# Patient Record
Sex: Male | Born: 1945 | Race: White | Hispanic: No | Marital: Married | Smoking: Former smoker
Health system: Southern US, Community
[De-identification: ages and names within clinical notes are randomized; demographics above are authoritative.]

## PROBLEM LIST (undated history)

## (undated) DIAGNOSIS — C787 Secondary malignant neoplasm of liver and intrahepatic bile duct: Secondary | ICD-10-CM

## (undated) DIAGNOSIS — R519 Headache, unspecified: Secondary | ICD-10-CM

## (undated) DIAGNOSIS — I10 Essential (primary) hypertension: Secondary | ICD-10-CM

## (undated) DIAGNOSIS — I00 Rheumatic fever without heart involvement: Secondary | ICD-10-CM

## (undated) DIAGNOSIS — F419 Anxiety disorder, unspecified: Secondary | ICD-10-CM

## (undated) DIAGNOSIS — C785 Secondary malignant neoplasm of large intestine and rectum: Secondary | ICD-10-CM

## (undated) DIAGNOSIS — C719 Malignant neoplasm of brain, unspecified: Secondary | ICD-10-CM

## (undated) DIAGNOSIS — I447 Left bundle-branch block, unspecified: Secondary | ICD-10-CM

## (undated) DIAGNOSIS — R42 Dizziness and giddiness: Secondary | ICD-10-CM

## (undated) DIAGNOSIS — R0989 Other specified symptoms and signs involving the circulatory and respiratory systems: Secondary | ICD-10-CM

## (undated) DIAGNOSIS — R51 Headache: Secondary | ICD-10-CM

## (undated) DIAGNOSIS — I771 Stricture of artery: Secondary | ICD-10-CM

## (undated) DIAGNOSIS — Z720 Tobacco use: Secondary | ICD-10-CM

## (undated) DIAGNOSIS — I639 Cerebral infarction, unspecified: Secondary | ICD-10-CM

## (undated) DIAGNOSIS — C3490 Malignant neoplasm of unspecified part of unspecified bronchus or lung: Secondary | ICD-10-CM

## (undated) HISTORY — DX: Essential (primary) hypertension: I10

## (undated) HISTORY — DX: Malignant neoplasm of unspecified part of unspecified bronchus or lung: C34.90

## (undated) HISTORY — DX: Tobacco use: Z72.0

## (undated) HISTORY — DX: Malignant neoplasm of brain, unspecified: C71.9

## (undated) HISTORY — DX: Rheumatic fever without heart involvement: I00

## (undated) HISTORY — PX: HERNIA REPAIR: SHX51

## (undated) HISTORY — DX: Cerebral infarction, unspecified: I63.9

## (undated) HISTORY — DX: Left bundle-branch block, unspecified: I44.7

## (undated) HISTORY — DX: Other specified symptoms and signs involving the circulatory and respiratory systems: R09.89

## (undated) HISTORY — DX: Stricture of artery: I77.1

## (undated) HISTORY — PX: MOHS SURGERY: SUR867

## (undated) HISTORY — PX: NOSE SURGERY: SHX723

---

## 1998-08-08 DIAGNOSIS — I639 Cerebral infarction, unspecified: Secondary | ICD-10-CM

## 1998-08-08 HISTORY — DX: Cerebral infarction, unspecified: I63.9

## 2002-08-08 HISTORY — PX: TRANSTHORACIC ECHOCARDIOGRAM: SHX275

## 2002-08-31 ENCOUNTER — Encounter: Payer: Self-pay | Admitting: Emergency Medicine

## 2002-08-31 ENCOUNTER — Inpatient Hospital Stay (HOSPITAL_COMMUNITY): Admission: EM | Admit: 2002-08-31 | Discharge: 2002-09-03 | Payer: Self-pay | Admitting: Emergency Medicine

## 2002-09-01 ENCOUNTER — Encounter: Payer: Self-pay | Admitting: *Deleted

## 2002-09-24 ENCOUNTER — Encounter: Admission: RE | Admit: 2002-09-24 | Discharge: 2002-12-23 | Payer: Self-pay | Admitting: Unknown Physician Specialty

## 2004-12-30 ENCOUNTER — Ambulatory Visit: Payer: Self-pay | Admitting: Family Medicine

## 2005-10-06 ENCOUNTER — Ambulatory Visit: Payer: Self-pay | Admitting: Family Medicine

## 2005-10-19 ENCOUNTER — Ambulatory Visit (HOSPITAL_COMMUNITY): Admission: RE | Admit: 2005-10-19 | Discharge: 2005-10-19 | Payer: Self-pay | Admitting: Family Medicine

## 2009-09-14 ENCOUNTER — Inpatient Hospital Stay (HOSPITAL_COMMUNITY): Admission: EM | Admit: 2009-09-14 | Discharge: 2009-09-16 | Payer: Self-pay | Admitting: Emergency Medicine

## 2009-11-20 ENCOUNTER — Ambulatory Visit (HOSPITAL_COMMUNITY): Admission: RE | Admit: 2009-11-20 | Discharge: 2009-11-20 | Payer: Self-pay | Admitting: General Surgery

## 2010-10-27 LAB — BASIC METABOLIC PANEL
BUN: 9 mg/dL (ref 6–23)
CO2: 26 mEq/L (ref 19–32)
Calcium: 9.9 mg/dL (ref 8.4–10.5)
Calcium: 7.7 mg/dL — ABNORMAL LOW (ref 8.4–10.5)
Chloride: 106 mEq/L (ref 96–112)
Creatinine, Ser: 1 mg/dL (ref 0.4–1.5)
Creatinine, Ser: 0.84 mg/dL (ref 0.4–1.5)
GFR calc Af Amer: >60 mL/min (ref >60)
GFR calc Af Amer: >60 mL/min (ref >60)
GFR calc non Af Amer: >60 mL/min (ref >60)
GFR calc non Af Amer: >60 mL/min (ref >60)
Glucose, Bld: 124 mg/dL — ABNORMAL HIGH (ref 70–99)
Potassium: 4.4 mEq/L (ref 3.5–5.1)
Sodium: 138 mEq/L (ref 135–145)
Sodium: 132 mEq/L — ABNORMAL LOW (ref 135–145)

## 2010-10-27 LAB — POCT I-STAT, CHEM 8
Creatinine, Ser: 1.9 mg/dL — ABNORMAL HIGH (ref 0.4–1.5)
HCT: 47 % (ref 39.0–52.0)
Hemoglobin: 16 g/dL (ref 13.0–17.0)
Potassium: 3.9 mEq/L (ref 3.5–5.1)
Sodium: 129 mEq/L — ABNORMAL LOW (ref 135–145)
TCO2: 23 mmol/L (ref 0–100)

## 2010-10-27 LAB — TSH: TSH: 1.616 u[IU]/mL (ref 0.350–4.500)

## 2010-10-27 LAB — DIFFERENTIAL
Basophils Absolute: 0 10*3/uL (ref 0.0–0.1)
Basophils Absolute: 0 10*3/uL (ref 0.0–0.1)
Basophils Relative: 1 % (ref 0–1)
Basophils Relative: 0 % (ref 0–1)
Eosinophils Absolute: 0.3 10*3/uL (ref 0.0–0.7)
Eosinophils Absolute: 0 10*3/uL (ref 0.0–0.7)
Eosinophils Relative: 4 % (ref 0–5)
Eosinophils Relative: 0 % (ref 0–5)
Lymphocytes Relative: 41 % (ref 12–46)
Lymphocytes Relative: 10 % — ABNORMAL LOW (ref 12–46)
Lymphs Abs: 3.1 10*3/uL (ref 0.7–4.0)
Lymphs Abs: 1.4 10*3/uL (ref 0.7–4.0)
Monocytes Absolute: 0.5 10*3/uL (ref 0.1–1.0)
Monocytes Absolute: 0.8 10*3/uL (ref 0.1–1.0)
Monocytes Relative: 7 % (ref 3–12)
Monocytes Relative: 6 % (ref 3–12)
Neutro Abs: 3.7 10*3/uL (ref 1.7–7.7)
Neutro Abs: 11.4 10*3/uL — ABNORMAL HIGH (ref 1.7–7.7)
Neutrophils Relative %: 48 % (ref 43–77)
Neutrophils Relative %: 84 % — ABNORMAL HIGH (ref 43–77)

## 2010-10-27 LAB — CBC
HCT: 43.2 % (ref 39.0–52.0)
HCT: 44.7 % (ref 39.0–52.0)
Hemoglobin: 14.9 g/dL (ref 13.0–17.0)
Hemoglobin: 11.6 g/dL — ABNORMAL LOW (ref 13.0–17.0)
Hemoglobin: 15.8 g/dL (ref 13.0–17.0)
MCHC: 34.6 g/dL (ref 30.0–36.0)
MCHC: 35.3 g/dL (ref 30.0–36.0)
MCV: 94.7 fL (ref 78.0–100.0)
MCV: 91.2 fL (ref 78.0–100.0)
Platelets: 200 10*3/uL (ref 150–400)
Platelets: 88 10*3/uL — ABNORMAL LOW (ref 150–400)
RBC: 4.56 MIL/uL (ref 4.22–5.81)
RBC: 3.61 MIL/uL — ABNORMAL LOW (ref 4.22–5.81)
RBC: 4.91 MIL/uL (ref 4.22–5.81)
RDW: 14.4 % (ref 11.5–15.5)
RDW: 14.4 % (ref 11.5–15.5)
WBC: 7.6 10*3/uL (ref 4.0–10.5)
WBC: 13.7 10*3/uL — ABNORMAL HIGH (ref 4.0–10.5)
WBC: 5.6 10*3/uL (ref 4.0–10.5)

## 2010-10-27 LAB — PROTIME-INR
INR: 0.95 (ref 0.00–1.49)
Prothrombin Time: 12.6 seconds (ref 11.6–15.2)

## 2010-10-27 LAB — CULTURE, BLOOD (ROUTINE X 2): Culture: NO GROWTH

## 2010-10-27 LAB — APTT: aPTT: 27 seconds (ref 24–37)

## 2010-12-24 NOTE — Discharge Summary (Signed)
NAMEMISAEL, Anthony Lopez NO.:  [id number]   MEDICAL RECORD NO.:  [id number]                   PATIENT TYPE:  INP   LOCATION:  3004                                 FACILITY:  [facility]   PHYSICIAN:  Deanna Artis. Hickling, M.D.           DATE OF BIRTH:  Jul 04, 1946   DATE OF ADMISSION:  08/31/2002  DATE OF DISCHARGE:  09/03/2002                                 DISCHARGE SUMMARY   DIAGNOSES AT TIME OF DISCHARGE:  1. Bilateral subcortical ischemic infarcts, one in the left putamen and the     parietal and subcortical white matter, and then one in the right dorsal     thalamus.  2. Hypertension, newly diagnosed.  3. Dyslipidemia, newly diagnosed.  4. Hyperhomocystinemia, newly diagnosed.  5. Type 2 diabetes, newly diagnosed.   DISCHARGE MEDICATIONS:  1. Aspirin 325 mg q.d.  2. Lopressor 50 mg b.i.d.  3. Foltx 1 q.d.   DISCHARGE DIET:  Low fat, low salt, low cholesterol, regular consistency,  thin liquids.   STUDIES PERFORMED:  1. CT of the head performed on admission shows a small hypodensity in the     left parietal white matter.  No acute hemorrhage.  Otherwise     unremarkable.  2. MRI of the brain which shows an acute ischemic infarct in the left     putamen, tail of the caudate a subacute right dorsal thalamic infarct.  3. MRA of the head and neck shows decreased signal at the bilateral MCA and     ACA without occlusion, the rest of the vasculature is normal.  4. A 2D echocardiogram shows an ejection fraction of 55-65% with no left     ventricular or regional wall abnormalities.  No embolic source.  5. EKG shows normal sinus rhythm with PAC.   LABORATORY STUDIES:  Hemoglobin A1c elevated at 9.9, homocystine elevated at  16.84.  Lipids shows normal cholesterol at 182, triglycerides 112, ACL 40,  and LDL elevated at 120.  CBC shows elevated white blood cells at 10.6,  hemoglobin 16.0.  Differential was normal.  The coagulation studies were  normal.   Chemistries were normal except for slightly elevated glucose at  115.  Liver function tests were normal.   HISTORY OF PRESENT ILLNESS:  The patient is a 65 year old right-handed white  male with no significant medical history, was awakened about 4 o'clock,  drank coffee, smoked a cigarette and went back to bed.  At 5:30 he woke up  and his finance noted he was staggering and his speech was slurred.  She  stated his words made sense, but sounded thick-tongued.  The patient denied  any focal weakness, numbness, visual disturbance , etc.  Symptoms persisted  and the patient was brought to the emergency department.  The patient was  thought to be a candidate secondary to time and lack of severity of  symptoms.   HOSPITAL COURSE:  The patient was admitted for stroke workup.  CT of the  head did show a small left parietal lacune with age indeterminate.  MRI  confirmed the presence of acute strokes, one in the left putamen, tail of  the caudate and one subacute in the right dorsal thalamus.  Workup was  negative for embolic source, however other significant risk factors were  identified including hypertension, cigarette abuse, dyslipidemia,  hyperhomocystinemia, and diabetes.  The patient was placed on aspirin for  stroke prophylaxis and his symptoms needed to be controlled for stroke  prevention in the future.   The patient will be discharged to home with followup by [facility] in 4-6 weeks, as  well as followup by primary care physician.  We will set up for diabetes  education as an outpatient, as well as diet education for wife.   CONDITION AT DISCHARGE:  The patient was alert and oriented x3.  Speech  dysarthric without __________ or paraphasia.  He has no aphasia.  His lower  face is weak with a droop.  Extraocular movements are intact.  Pupils equal  and reactive.  Visual fields full.  Strength is normal.  He has a mild left  pronator drift.  He had, however, clumsy  left hand with decreased  fine  motor movement.  Chest was clear to auscultation.  His heart rate is  regular, and his gait is steady.   PLAN:  1. Aspirin 325 q.d. for stroke prophylaxis.  2. Lopressor started for hypertension control.  Consider ACE inhibitor in     future.  3. Will follow up lipids as an outpatient.  He may need to consider statin     in the future.  The patient needs diabetic education.  [facility].  Hopefully can be diet-controlled.  4. Stop smoking. Referral given to heart center for free smoking cessation     classes.  5. Followup with [facility] in 4-6 weeks.  Make an appointment with the P.A[facility] on the     days that Dr. Sharene Skeans is there.  6. Diet education and adjustment per wife.  7. Discharged to home with wife.     Annie Main, N.P.                         Deanna Artis. Sharene Skeans, M.D.    SB/MEDQ  D:  09/03/2002  T:  09/03/2002  Job:  [id number]   cc:   Deanna Artis. Sharene Skeans, M.D.  [address]  [address]  [place]  [place] [address]  Fax: [phone]   Haydon Dorris  [address]  [place]  [place] [address]  Fax: [phone]   Loretha Brasil  [address]  [place]  [place] [address]  Fax: [phone]

## 2013-07-16 ENCOUNTER — Ambulatory Visit (INDEPENDENT_AMBULATORY_CARE_PROVIDER_SITE_OTHER): Payer: Medicare Other | Admitting: Physician Assistant

## 2013-07-16 ENCOUNTER — Encounter: Payer: Self-pay | Admitting: Physician Assistant

## 2013-07-16 VITALS — BP 140/80 | HR 62 | Ht 70.0 in | Wt 145.0 lb

## 2013-07-16 DIAGNOSIS — Z8619 Personal history of other infectious and parasitic diseases: Secondary | ICD-10-CM

## 2013-07-16 DIAGNOSIS — R0989 Other specified symptoms and signs involving the circulatory and respiratory systems: Secondary | ICD-10-CM | POA: Insufficient documentation

## 2013-07-16 DIAGNOSIS — R0789 Other chest pain: Secondary | ICD-10-CM

## 2013-07-16 DIAGNOSIS — Z720 Tobacco use: Secondary | ICD-10-CM

## 2013-07-16 DIAGNOSIS — F17200 Nicotine dependence, unspecified, uncomplicated: Secondary | ICD-10-CM

## 2013-07-16 DIAGNOSIS — I10 Essential (primary) hypertension: Secondary | ICD-10-CM

## 2013-07-16 DIAGNOSIS — R079 Chest pain, unspecified: Secondary | ICD-10-CM

## 2013-07-16 DIAGNOSIS — I447 Left bundle-branch block, unspecified: Secondary | ICD-10-CM

## 2013-07-16 DIAGNOSIS — I639 Cerebral infarction, unspecified: Secondary | ICD-10-CM

## 2013-07-16 DIAGNOSIS — I6350 Cerebral infarction due to unspecified occlusion or stenosis of unspecified cerebral artery: Secondary | ICD-10-CM

## 2013-07-16 DIAGNOSIS — Z8679 Personal history of other diseases of the circulatory system: Secondary | ICD-10-CM | POA: Insufficient documentation

## 2013-07-16 NOTE — Progress Notes (Signed)
Date:  07/16/2013   ID:  Hassell Halim, DOB [birth year], MRN [id number]  PCP:  No primary provider on file.  Primary Cardiologist:  New patient Herbie Baltimore)     History of Present Illness: Shante Maysonet is a 67 y.o. male with a history of tobacco abuse since age 77, hypertension, left bundle branch block, CVA in approximately 2000, rule out rheumatic fever age 52-15. His father is deceased at age 70 and had coronary artery bypass grafting at age 38 of diabetes. His mother is currently living age 13 and has diabetes.  Patient is seen at [facility].  Patient reports that he's had a cough and drainage for about 2 weeks and was told to take Sudafed. He complains of a stuffy nose. Yesterday he developed sharp chest pain which lasted approximately an hour period was 5/10 in intensity it did not radiate. He also denies nausea, vomiting, shortness of breath, dizziness, diaphoresis.  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  He worked this past Sunday without any symptoms.  He also denies orthopnea, dizziness, PND, abdominal pain, hematochezia, melena, lower extremity edema, claudication.  Wt Readings from Last 3 Encounters:  07/16/13 145 lb (65.772 kg)     Past Medical History  Diagnosis Date  . CVA (cerebral infarction) 2000  . HTN (hypertension), benign   . Rheumatic fever     Age 75 or 17  . Tobacco abuse     Current Outpatient Prescriptions  Medication Sig Dispense Refill  . aspirin 81 MG tablet Take 81 mg by mouth daily.      Marland Kitchen lisinopril (PRINIVIL,ZESTRIL) 10 MG tablet Take 10 mg by mouth daily.      . Omega-3 Fatty Acids (FISH OIL) 1000 MG CAPS Take by mouth.       No current facility-administered medications for this visit.    Allergies:   Allergies not on file  Social History:  The patient  reports that he has been smoking  Cigarettes.  He has a 125 pack-year smoking history. He does not have any smokeless tobacco history on file.   Family history:   Family History  Problem Relation Age of Onset  . Diabetes Mother   . Diabetes Father   . Heart attack Father     ROS:  Please see the history of present illness.  All other systems reviewed and negative.   PHYSICAL EXAM: VS:  BP 140/80  Pulse 62  Ht 5\' 10"  (1.778 m)  Wt 145 lb (65.772 kg)  BMI 20.81 kg/m2 Well nourished, well developed, in no acute distress HEENT: Pupils are equal round react to light accommodation extraocular movements are intact.  Neck: no JVDNo cervical lymphadenopathy.  Bilateral carotid bruits Cardiac: Regular rate and rhythm with 1/6  Murmur. No rubs or gallops. Lungs:  clear to auscultation bilaterally, no wheezing, rhonchi or rales Abd: soft, nontender, positive bowel sounds all quadrants, no hepatosplenomegaly Ext: no lower extremity edema.  2+ radial and dorsalis pedis pulses. Skin: warm and dry Neuro:  Grossly normal  EKG:  NSR, LBBB 62BPM    ASSESSMENT AND PLAN:  Problem List Items Addressed This Visit   CVA (cerebral vascular accident); 2000   Relevant Medications      lisinopril (PRINIVIL,ZESTRIL) 10 MG tablet      aspirin 81 MG tablet   Hx of rheumatic fever   Tobacco  abuse     We discussed tobacco cessation for about five minutes and included nicotine substitutes and chantix.  He is contemplating it.      HTN (hypertension)     BP well controlled on lisinopril.    Chest pain, atypical     I do not think the patient's CP is typical for cardiac etiology, however, he does have risk factors and a LBBB.  The LBBB was seen on an EKG from June 2014.  I am going to schedule a exercise myoview and follow up with Dr. Herbie Baltimore.    LBBB (left bundle branch block), chronic   Relevant Orders      Myocardial Perfusion Imaging   Carotid bruit present, bilateral     Carotid dopplers will be scheduled.     Other Visit  Diagnoses   Chest pain    -  Primary    Relevant Orders       EKG 12-Lead       Myocardial Perfusion Imaging    Carotid bruit        Relevant Orders       Doppler carotid    Tobacco use        Relevant Orders       Myocardial Perfusion Imaging

## 2013-07-16 NOTE — Assessment & Plan Note (Signed)
Carotid dopplers will be scheduled.

## 2013-07-16 NOTE — Assessment & Plan Note (Signed)
We discussed tobacco cessation for about five minutes and included nicotine substitutes and chantix.  He is contemplating it.

## 2013-07-16 NOTE — Patient Instructions (Signed)
1.  We will schedule you for a stress test and ultrasound of your neck arteries. 2.  Follow up with Dr. Herbie Baltimore after the studies are complete.

## 2013-07-16 NOTE — Assessment & Plan Note (Signed)
BP well controlled on lisinopril

## 2013-07-16 NOTE — Assessment & Plan Note (Signed)
I do not think the patient's CP is typical for cardiac etiology, however, he does have risk factors and a LBBB.  The LBBB was seen on an EKG from June 2014.  I am going to schedule a exercise myoview and follow up with Dr. Herbie Baltimore.

## 2013-07-18 ENCOUNTER — Ambulatory Visit (HOSPITAL_COMMUNITY)
Admission: RE | Admit: 2013-07-18 | Discharge: 2013-07-18 | Disposition: A | Discharge status: Home / Self Care | Payer: Medicare Other | Source: Ambulatory Visit | Attending: Internal Medicine | Admitting: Internal Medicine

## 2013-07-18 DIAGNOSIS — R0989 Other specified symptoms and signs involving the circulatory and respiratory systems: Secondary | ICD-10-CM

## 2013-07-18 DIAGNOSIS — I771 Stricture of artery: Secondary | ICD-10-CM | POA: Insufficient documentation

## 2013-07-18 HISTORY — PX: OTHER SURGICAL HISTORY: SHX169

## 2013-07-18 NOTE — Progress Notes (Signed)
Carotid Duplex Completed. °Brianna L Mazza,RVT °

## 2013-07-19 ENCOUNTER — Encounter: Payer: Self-pay | Admitting: Cardiology

## 2013-07-22 ENCOUNTER — Telehealth: Payer: Self-pay | Admitting: *Deleted

## 2013-07-22 NOTE — Telephone Encounter (Signed)
Order placed for repeat carotid artery doppler.

## 2013-07-22 NOTE — Telephone Encounter (Signed)
Message copied by Marella Bile on Mon Jul 22, 2013  3:10 PM ------      Message from: Runell Gess      Created: Mon Jul 22, 2013  8:33 AM       Mildly abn study. Repeat 12 months ------

## 2013-07-26 ENCOUNTER — Encounter (HOSPITAL_COMMUNITY): Payer: Medicare Other

## 2013-08-06 ENCOUNTER — Ambulatory Visit (HOSPITAL_COMMUNITY)
Admission: RE | Admit: 2013-08-06 | Discharge: 2013-08-06 | Disposition: A | Discharge status: Home / Self Care | Payer: Medicare Other | Source: Ambulatory Visit | Attending: Cardiovascular Disease | Admitting: Cardiovascular Disease

## 2013-08-06 DIAGNOSIS — R079 Chest pain, unspecified: Secondary | ICD-10-CM

## 2013-08-06 DIAGNOSIS — Z8673 Personal history of transient ischemic attack (TIA), and cerebral infarction without residual deficits: Secondary | ICD-10-CM | POA: Insufficient documentation

## 2013-08-06 DIAGNOSIS — R0989 Other specified symptoms and signs involving the circulatory and respiratory systems: Secondary | ICD-10-CM | POA: Insufficient documentation

## 2013-08-06 DIAGNOSIS — I10 Essential (primary) hypertension: Secondary | ICD-10-CM | POA: Insufficient documentation

## 2013-08-06 DIAGNOSIS — F17200 Nicotine dependence, unspecified, uncomplicated: Secondary | ICD-10-CM | POA: Insufficient documentation

## 2013-08-06 DIAGNOSIS — I447 Left bundle-branch block, unspecified: Secondary | ICD-10-CM

## 2013-08-06 DIAGNOSIS — Z8249 Family history of ischemic heart disease and other diseases of the circulatory system: Secondary | ICD-10-CM | POA: Insufficient documentation

## 2013-08-06 DIAGNOSIS — Z720 Tobacco use: Secondary | ICD-10-CM

## 2013-08-06 HISTORY — PX: NM MYOVIEW LTD: HXRAD82

## 2013-08-06 MED ORDER — REGADENOSON 0.4 MG/5ML IV SOLN
0.4000 mg | Freq: Once | INTRAVENOUS | Status: AC
  Administered 2013-08-06: 0.4 mg via INTRAVENOUS

## 2013-08-06 MED ORDER — AMINOPHYLLINE 25 MG/ML IV SOLN
100.0000 mg | Freq: Once | INTRAVENOUS | Status: AC
  Administered 2013-08-06: 100 mg via INTRAVENOUS

## 2013-08-06 MED ORDER — TECHNETIUM TC 99M SESTAMIBI GENERIC - CARDIOLITE
30.0000 | Freq: Once | INTRAVENOUS | Status: AC | PRN
  Administered 2013-08-06: 30 via INTRAVENOUS

## 2013-08-06 MED ORDER — TECHNETIUM TC 99M SESTAMIBI GENERIC - CARDIOLITE
10.0000 | Freq: Once | INTRAVENOUS | Status: AC | PRN
  Administered 2013-08-06: 10 via INTRAVENOUS

## 2013-08-06 NOTE — Procedures (Addendum)
[facility] [address] [place] [address] [phone]  Cardiology Nuclear Med Study  Legend Anthony Lopez is a 67 y.o. male     MRN : [id number]     DOB: Jan 06, 1946  Procedure Date: 08/06/2013  Nuclear Med Background Indication for Stress Test:  Evaluation for Ischemia History:  RHF;MURMUR Cardiac Risk Factors: CVA, Family History - CAD, Hypertension, LBBB, Smoker and BILATERAL CAROTID BRUITS  Symptoms:  Chest Pain   Nuclear Pre-Procedure Caffeine/Decaff Intake:  7:00pm NPO After: 5:00am   IV Site: R Forearm  IV 0.9% NS with Angio Cath:  22g  Chest Size (in):  36"  IV Started by: Emmit Pomfret, RN  Height: 5\' 10"  (1.778 m)  Cup Size: n/a  BMI:  Body mass index is 20.81 kg/(m^2). Weight:  145 lb (65.772 kg)   Tech Comments:  N/A    Nuclear Med Study 1 or 2 day study: 1 day  Stress Test Type:  Lexiscan  Order Authorizing Provider:  Bryan Lemma, MD   Resting Radionuclide: Technetium 27m Sestamibi  Resting Radionuclide Dose: 9.5 mCi   Stress Radionuclide:  Technetium 43m Sestamibi  Stress Radionuclide Dose: 30.6 mCi           Stress Protocol Rest HR: 52 Stress HR: 76  Rest BP: 137/70 Stress BP: 151/69  Exercise Time (min): n/a METS: n/a   Predicted Max HR: 155 bpm % Max HR: 49.03 bpm Rate Pressure Product: 95284  Dose of Adenosine (mg):  n/a Dose of Lexiscan: 0.4 mg  Dose of Atropine (mg): n/a Dose of Dobutamine: n/a mcg/kg/min (at max HR)  Stress Test Technologist: Esperanza Sheets, CCT Nuclear Technologist: Koren Shiver, CNMT   Rest Procedure:  Myocardial perfusion imaging was performed at rest 45 minutes following the intravenous administration of Technetium 42m Sestamibi. Stress Procedure:  The patient received IV Lexiscan 0.4 mg over 15-seconds.  Technetium 74m Sestamibi injected at 30-seconds.  There were no significant changes with Lexiscan. The patient experienced SOB and nausea; 100 mg of IV  Aminophylline was administered with resolution of symptoms.  Quantitative spect images were obtained after a 45 minute delay.  Transient Ischemic Dilatation (Normal <1.22):  1.01 Lung/Heart Ratio (Normal <0.45):  0.27 QGS EDV:  94 ml QGS ESV:  45 ml LV Ejection Fraction: 53%  Signed by       Rest ECG: NSR-LBBB  Stress ECG: No significant change from baseline ECG  QPS Raw Data Images:  Normal; no motion artifact; normal heart/lung ratio. Stress Images:  There is decreased uptake in the septum. Rest Images:  There is decreased uptake in the septum. Subtraction (SDS):  No evidence of ischemia.  Impression Exercise Capacity:  Lexiscan with no exercise. BP Response:  Normal blood pressure response. Clinical Symptoms:  No significant symptoms noted. ECG Impression:  No significant ST segment change suggestive of ischemia. Comparison with Prior Nuclear Study: No images to compare  Overall Impression:  Low risk stress nuclear study Septal thinning c/w BBB artifact.  LV Wall Motion:  NL LV Function; NL Wall Motion   Runell Gess, MD  08/06/2013 1:45 PM

## 2013-08-09 ENCOUNTER — Encounter: Payer: Self-pay | Admitting: Cardiology

## 2013-08-09 ENCOUNTER — Ambulatory Visit (INDEPENDENT_AMBULATORY_CARE_PROVIDER_SITE_OTHER): Payer: Medicare HMO | Admitting: Cardiology

## 2013-08-09 VITALS — BP 120/62 | HR 80 | Ht 71.0 in | Wt 144.5 lb

## 2013-08-09 DIAGNOSIS — R0789 Other chest pain: Secondary | ICD-10-CM

## 2013-08-09 DIAGNOSIS — Z8619 Personal history of other infectious and parasitic diseases: Secondary | ICD-10-CM

## 2013-08-09 DIAGNOSIS — Z720 Tobacco use: Secondary | ICD-10-CM

## 2013-08-09 DIAGNOSIS — Z8679 Personal history of other diseases of the circulatory system: Secondary | ICD-10-CM

## 2013-08-09 DIAGNOSIS — F17200 Nicotine dependence, unspecified, uncomplicated: Secondary | ICD-10-CM

## 2013-08-09 DIAGNOSIS — I447 Left bundle-branch block, unspecified: Secondary | ICD-10-CM

## 2013-08-09 DIAGNOSIS — I708 Atherosclerosis of other arteries: Secondary | ICD-10-CM

## 2013-08-09 DIAGNOSIS — R0989 Other specified symptoms and signs involving the circulatory and respiratory systems: Secondary | ICD-10-CM

## 2013-08-09 DIAGNOSIS — I10 Essential (primary) hypertension: Secondary | ICD-10-CM

## 2013-08-09 DIAGNOSIS — I771 Stricture of artery: Secondary | ICD-10-CM

## 2013-08-09 NOTE — Progress Notes (Signed)
Date:  08/11/2013   ID:  Anthony Lopez, DOB February 10, 1946, MRN [id number]  PCP:  KAPLAN,KRISTEN, Gonzales Sharmaine Base)  Primary Cardiologist:  New patient Anthony Lopez); Anthony Fuller, PA  Chief Complaint: Chief Complaint  Patient presents with  . Follow-up    result myoview, no chest pain ,no sob ,no edema    History of Present Illness: Anthony Lopez is a 68 y.o. male with a past history of hypertension and long-term smoking as well as known left bundle branch block. He has a history of stroke apparently he may have had rheumatic fever in his midteens. He was seen by Anthony Fuller, PA December th to evaluate an episode of sharp chest pain shortly after taking Sudafed. He noted that he been dealing with a stuffy nose and congestion consistent with upper respiratory symptoms. He has not had any further episodes of chest pain since. D2 is that of a branch block, and chest discomfort he was evaluated with a Myoview stress test that demonstrated a bundle branch block related artifact but no ischemia.  He has not had any further episodes of chest pain or pressure with rest or exertion. He does have mild baseline exertional dyspnea but nothing at rest. He has also gone back to work with no further symptoms. He also denies nausea, vomiting, shortness of breath, dizziness, diaphoresis.   He also denies orthopnea, dizziness, PND, abdominal pain, hematochezia, melena, lower extremity edema, claudication.  Wt Readings from Last 3 Encounters:  08/09/13 144 lb 8 oz (65.545 kg)  08/06/13 145 lb (65.772 kg)  07/16/13 145 lb (65.772 kg)     Past Medical History  Diagnosis Date  . CVA (cerebral infarction) 2000  . HTN (hypertension), benign   . Rheumatic fever     Age 42 or 77  . Tobacco abuse    Past Surgical History  Procedure Laterality Date  . Transthoracic echocardiogram  January 2004    Normal LV size and function. EF 55-65%.  . Carotid dopplers Bilateral 07/18/2013    Right  subclavian 0-49%, left subclavian mild bilateral carotid stenosis..  . 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     Current Outpatient Prescriptions  Medication Sig Dispense Refill  . aspirin 81 MG tablet Take 81 mg by mouth daily.      Marland Kitchen lisinopril (PRINIVIL,ZESTRIL) 10 MG tablet Take 10 mg by mouth daily.      . Omega-3 Fatty Acids (FISH OIL) 1000 MG CAPS Take by mouth.       No current facility-administered medications for this visit.    Allergies  Allergen Reactions  . Penicillins Other (See Comments)    unspecified   Social History:  The patient  reports that he has been smoking Cigarettes.  He has a 125 pack-year smoking history. He does not have any smokeless tobacco history on file.  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   Family History:   His father is deceased at age 79 and had coronary artery bypass grafting at age 47 of diabetes. His mother is currently living age 46 and has diabetes.   ROS:  A comprehensive ROS was performed -- Please see the history of present illness.  All other  systems reviewed and negative.   PHYSICAL EXAM: VS:  BP 120/62  Pulse 80  Ht 5\' 11"  (1.803 m)  Wt 144 lb 8 oz (65.545 kg)  BMI 20.16 kg/m2 Well nourished, well developed, in no acute distress HEENT: Pupils are equal round react to light accommodation extraocular movements are intact.  Neck: no JVDNo cervical lymphadenopathy.  Bilateral carotid bruits (left greater than right may be subclavian as opposed to carotid.) Cardiac: RRR, and normal S1-S2. Soft, barely auscultate well 1/6  systolic ejection Murmur. No rubs or gallops.Mild tenderness along the right sternal margin. Lungs: Mostly clear, and  nonlabored. He does have some mild sounds and coarse upper airway sounds Non-labored. Abd: soft, nontender, positive bowel sounds all quadrants, no hepatosplenomegaly Ext: no lower extremity edema.  2+ radial and dorsalis pedis pulses. Skin: warm and dry Neuro:  Grossly normal  EKG: Not done  ASSESSMENT AND PLAN:  Problem List Items Addressed This Visit   Tobacco abuse     I did try to go over some smoking cessation counseling while discuss the results of the stress test, however he did not seem all that she and discussion. I agree that he is not yet in the contemplation state.    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 There was no evidence of ischemia in this distribution. This is again most likely related to the bundle branch block. Most likely not related to an ischemic etiology. This is a chronic finding.    Hx of rheumatic fever (Chronic)     His echocardiogram in 2004 did not suggest any evidence of significant valvular disease.    HTN (hypertension) (Chronic)     Well controlled on ACE inhibitor. Monitored by PCP.    Chest pain, atypical - Primary (Chronic)     I concur with Mr. Amador Cunas initial assessment that the chest pain was quite atypical for cardiac etiology. Stress test was unrevealing with no evidence of ischemia. This would concur with the clinical diagnosis. Most likely musculoskeletal pain related to coughing. He still has some mild tenderness that is reproducible on exam. He probably has some mild costochondritis symptoms from his recent upper respiratory tract infection.      Carotid bruit present, bilateral (Chronic)     Interestingly, he does have some PAD that was thought suggested on these Dopplers but not carotid disease. It is grossly subclavian disease  noted. He denies any subclavian steal symptoms. But this would suggest the presence of atherosclerotic vascular disease making close the joints of cardiac risk factors continue blood pressure and lipid control as well as smoking cessation.      Followup when necessary. Would recommend repeating subclavian Dopplers yearly.   Leonie Man, M.D., M.S. [facility] [address] [place], [place]  [address]  [phone] Pager # [phone]

## 2013-08-09 NOTE — Patient Instructions (Signed)
Your stress test was negative.  Follow up with Dr Ellyn Hack on an as needed basis.

## 2013-08-10 ENCOUNTER — Encounter: Payer: Self-pay | Admitting: Cardiology

## 2013-08-10 NOTE — Assessment & Plan Note (Signed)
His echocardiogram in 2004 did not suggest any evidence of significant valvular disease.

## 2013-08-10 NOTE — Assessment & Plan Note (Signed)
I did try to go over some smoking cessation counseling while discuss the results of the stress test, however he did not seem all that she and discussion. I agree that he is not yet in the contemplation state.

## 2013-08-10 NOTE — Assessment & Plan Note (Signed)
Interestingly, he does have some PAD that was thought suggested on these Dopplers but not carotid disease. It is grossly subclavian disease noted. He denies any subclavian steal symptoms. But this would suggest the presence of atherosclerotic vascular disease making close the joints of cardiac risk factors continue blood pressure and lipid control as well as smoking cessation.

## 2013-08-10 NOTE — Assessment & Plan Note (Signed)
Well controlled on ACE inhibitor. Monitored by PCP.

## 2013-08-10 NOTE — Assessment & Plan Note (Signed)
Unfortunately the presence of left lumbar/makes a Myoview difficult to interpret because of the commonly found septal defect. There was no evidence of ischemia in this distribution. This is again most likely related to the bundle branch block. Most likely not related to an ischemic etiology. This is a chronic finding.

## 2013-08-10 NOTE — Assessment & Plan Note (Signed)
I concur with Anthony Lopez initial assessment that the chest pain was quite atypical for cardiac etiology. Stress test was unrevealing with no evidence of ischemia. This would concur with the clinical diagnosis. Most likely musculoskeletal pain related to coughing. He still has some mild tenderness that is reproducible on exam. He probably has some mild costochondritis symptoms from his recent upper respiratory tract infection.

## 2014-07-01 ENCOUNTER — Telehealth (HOSPITAL_COMMUNITY): Payer: Self-pay | Admitting: *Deleted

## 2014-07-03 ENCOUNTER — Encounter (HOSPITAL_COMMUNITY): Payer: Self-pay | Admitting: *Deleted

## 2014-07-03 ENCOUNTER — Inpatient Hospital Stay (HOSPITAL_COMMUNITY)
Admission: EM | Admit: 2014-07-03 | Discharge: 2014-07-07 | DRG: 054 | Disposition: A | Discharge status: Home Health | Payer: Medicare HMO | Attending: Oncology | Admitting: Oncology

## 2014-07-03 ENCOUNTER — Inpatient Hospital Stay (HOSPITAL_COMMUNITY): Payer: Medicare HMO

## 2014-07-03 ENCOUNTER — Emergency Department (HOSPITAL_COMMUNITY): Payer: Medicare HMO

## 2014-07-03 DIAGNOSIS — Z9889 Other specified postprocedural states: Secondary | ICD-10-CM

## 2014-07-03 DIAGNOSIS — I447 Left bundle-branch block, unspecified: Secondary | ICD-10-CM | POA: Diagnosis present

## 2014-07-03 DIAGNOSIS — Z8249 Family history of ischemic heart disease and other diseases of the circulatory system: Secondary | ICD-10-CM

## 2014-07-03 DIAGNOSIS — C7931 Secondary malignant neoplasm of brain: Secondary | ICD-10-CM | POA: Diagnosis present

## 2014-07-03 DIAGNOSIS — R262 Difficulty in walking, not elsewhere classified: Secondary | ICD-10-CM | POA: Diagnosis present

## 2014-07-03 DIAGNOSIS — Z681 Body mass index (BMI) 19 or less, adult: Secondary | ICD-10-CM

## 2014-07-03 DIAGNOSIS — R066 Hiccough: Secondary | ICD-10-CM | POA: Diagnosis not present

## 2014-07-03 DIAGNOSIS — C3490 Malignant neoplasm of unspecified part of unspecified bronchus or lung: Secondary | ICD-10-CM | POA: Diagnosis present

## 2014-07-03 DIAGNOSIS — Z720 Tobacco use: Secondary | ICD-10-CM | POA: Diagnosis present

## 2014-07-03 DIAGNOSIS — I10 Essential (primary) hypertension: Secondary | ICD-10-CM | POA: Diagnosis present

## 2014-07-03 DIAGNOSIS — Z7982 Long term (current) use of aspirin: Secondary | ICD-10-CM

## 2014-07-03 DIAGNOSIS — I739 Peripheral vascular disease, unspecified: Secondary | ICD-10-CM | POA: Diagnosis present

## 2014-07-03 DIAGNOSIS — Z8673 Personal history of transient ischemic attack (TIA), and cerebral infarction without residual deficits: Secondary | ICD-10-CM | POA: Diagnosis not present

## 2014-07-03 DIAGNOSIS — R911 Solitary pulmonary nodule: Secondary | ICD-10-CM

## 2014-07-03 DIAGNOSIS — R634 Abnormal weight loss: Secondary | ICD-10-CM | POA: Diagnosis present

## 2014-07-03 DIAGNOSIS — F17210 Nicotine dependence, cigarettes, uncomplicated: Secondary | ICD-10-CM | POA: Diagnosis present

## 2014-07-03 DIAGNOSIS — C3432 Malignant neoplasm of lower lobe, left bronchus or lung: Secondary | ICD-10-CM | POA: Diagnosis present

## 2014-07-03 DIAGNOSIS — R269 Unspecified abnormalities of gait and mobility: Secondary | ICD-10-CM

## 2014-07-03 DIAGNOSIS — R519 Headache, unspecified: Secondary | ICD-10-CM

## 2014-07-03 DIAGNOSIS — R110 Nausea: Secondary | ICD-10-CM | POA: Diagnosis not present

## 2014-07-03 DIAGNOSIS — G936 Cerebral edema: Secondary | ICD-10-CM | POA: Diagnosis present

## 2014-07-03 DIAGNOSIS — G9389 Other specified disorders of brain: Secondary | ICD-10-CM

## 2014-07-03 DIAGNOSIS — Z85828 Personal history of other malignant neoplasm of skin: Secondary | ICD-10-CM

## 2014-07-03 DIAGNOSIS — R51 Headache: Secondary | ICD-10-CM

## 2014-07-03 DIAGNOSIS — R918 Other nonspecific abnormal finding of lung field: Secondary | ICD-10-CM

## 2014-07-03 LAB — CBC WITH DIFFERENTIAL/PLATELET
Basophils Absolute: 0 10*3/uL (ref 0.0–0.1)
Basophils Relative: 0 % (ref 0–1)
EOS PCT: 4 % (ref 0–5)
Eosinophils Absolute: 0.3 10*3/uL (ref 0.0–0.7)
HCT: 34.7 % — ABNORMAL LOW (ref 39.0–52.0)
HEMOGLOBIN: 11.7 g/dL — AB (ref 13.0–17.0)
LYMPHS ABS: 3.2 10*3/uL (ref 0.7–4.0)
Lymphocytes Relative: 41 % (ref 12–46)
MCH: 30.7 pg (ref 26.0–34.0)
MCHC: 33.7 g/dL (ref 30.0–36.0)
MCV: 91.1 fL (ref 78.0–100.0)
MONOS PCT: 8 % (ref 3–12)
Monocytes Absolute: 0.6 10*3/uL (ref 0.1–1.0)
Neutro Abs: 3.7 10*3/uL (ref 1.7–7.7)
Neutrophils Relative %: 47 % (ref 43–77)
Platelets: 151 10*3/uL (ref 150–400)
RBC: 3.81 MIL/uL — AB (ref 4.22–5.81)
RDW: 12.9 % (ref 11.5–15.5)
WBC: 7.8 10*3/uL (ref 4.0–10.5)

## 2014-07-03 LAB — COMPREHENSIVE METABOLIC PANEL
ALBUMIN: 3.4 g/dL — AB (ref 3.5–5.2)
ALK PHOS: 57 U/L (ref 39–117)
ALT: 9 U/L (ref 0–53)
AST: 11 U/L (ref 0–37)
Anion gap: 9 (ref 5–15)
BUN: 30 mg/dL — AB (ref 6–23)
CALCIUM: 9.1 mg/dL (ref 8.4–10.5)
CO2: 26 mEq/L (ref 19–32)
CREATININE: 0.98 mg/dL (ref 0.50–1.35)
Chloride: 104 mEq/L (ref 96–112)
GFR calc non Af Amer: 84 mL/min — ABNORMAL LOW (ref >90)
GLUCOSE: 81 mg/dL (ref 70–99)
POTASSIUM: 4.3 meq/L (ref 3.7–5.3)
Sodium: 139 mEq/L (ref 137–147)
Total Bilirubin: 0.2 mg/dL — ABNORMAL LOW (ref 0.3–1.2)
Total Protein: 6.4 g/dL (ref 6.0–8.3)

## 2014-07-03 LAB — URINE MICROSCOPIC-ADD ON

## 2014-07-03 LAB — URINALYSIS, ROUTINE W REFLEX MICROSCOPIC
BILIRUBIN URINE: NEGATIVE
Glucose, UA: NEGATIVE mg/dL
Ketones, ur: NEGATIVE mg/dL
LEUKOCYTES UA: NEGATIVE
NITRITE: NEGATIVE
PH: 6.5 (ref 5.0–8.0)
Protein, ur: NEGATIVE mg/dL
SPECIFIC GRAVITY, URINE: 1.026 (ref 1.005–1.030)
UROBILINOGEN UA: 1 mg/dL (ref 0.0–1.0)

## 2014-07-03 LAB — MRSA PCR SCREENING: MRSA by PCR: NEGATIVE

## 2014-07-03 MED ORDER — ACETAMINOPHEN 325 MG PO TABS
650.0000 mg | ORAL_TABLET | Freq: Four times a day (QID) | ORAL | Status: DC | PRN
Start: 2014-07-03 — End: 2014-07-07
  Administered 2014-07-05 – 2014-07-07 (×4): 650 mg via ORAL
  Filled 2014-07-03 (×4): qty 2

## 2014-07-03 MED ORDER — ONDANSETRON HCL 4 MG/2ML IJ SOLN: 4.0000 mg | Freq: Four times a day (QID) | INTRAMUSCULAR | Status: DC | PRN

## 2014-07-03 MED ORDER — NICOTINE 21 MG/24HR TD PT24
21.0000 mg | MEDICATED_PATCH | Freq: Every day | TRANSDERMAL | Status: DC
  Administered 2014-07-04: 21 mg via TRANSDERMAL
  Filled 2014-07-03 (×2): qty 1

## 2014-07-03 MED ORDER — DEXAMETHASONE SODIUM PHOSPHATE 4 MG/ML IJ SOLN
4.0000 mg | Freq: Four times a day (QID) | INTRAMUSCULAR | Status: DC
  Administered 2014-07-04 – 2014-07-07 (×16): 4 mg via INTRAVENOUS
  Filled 2014-07-03 (×18): qty 1

## 2014-07-03 MED ORDER — LISINOPRIL 10 MG PO TABS
10.0000 mg | ORAL_TABLET | Freq: Every day | ORAL | Status: DC
  Administered 2014-07-04 – 2014-07-07 (×4): 10 mg via ORAL
  Filled 2014-07-03 (×5): qty 1

## 2014-07-03 MED ORDER — IOHEXOL 300 MG/ML  SOLN
100.0000 mL | Freq: Once | INTRAMUSCULAR | Status: AC | PRN
  Administered 2014-07-03: 100 mL via INTRAVENOUS

## 2014-07-03 MED ORDER — SODIUM CHLORIDE 0.9 % IJ SOLN
3.0000 mL | Freq: Two times a day (BID) | INTRAMUSCULAR | Status: DC
  Administered 2014-07-04 – 2014-07-07 (×6): 3 mL via INTRAVENOUS

## 2014-07-03 MED ORDER — ACETAMINOPHEN 650 MG RE SUPP
650.0000 mg | Freq: Four times a day (QID) | RECTAL | Status: DC | PRN
Start: 2014-07-03 — End: 2014-07-07

## 2014-07-03 MED ORDER — DEXAMETHASONE SODIUM PHOSPHATE 10 MG/ML IJ SOLN
10.0000 mg | Freq: Once | INTRAMUSCULAR | Status: AC
  Administered 2014-07-03: 10 mg via INTRAVENOUS
  Filled 2014-07-03: qty 1

## 2014-07-03 MED ORDER — METOCLOPRAMIDE HCL 5 MG/ML IJ SOLN
10.0000 mg | Freq: Once | INTRAMUSCULAR | Status: AC
  Administered 2014-07-03: 10 mg via INTRAVENOUS
  Filled 2014-07-03: qty 2

## 2014-07-03 MED ORDER — ENOXAPARIN SODIUM 40 MG/0.4ML ~~LOC~~ SOLN
40.0000 mg | SUBCUTANEOUS | Status: DC
  Filled 2014-07-03 (×2): qty 0.4

## 2014-07-03 MED ORDER — IOHEXOL 300 MG/ML  SOLN
25.0000 mL | INTRAMUSCULAR | Status: AC
  Administered 2014-07-03: 25 mL via ORAL

## 2014-07-03 NOTE — ED Provider Notes (Signed)
CSN: [id number]     Arrival date & time 07/03/14  1650 History   First MD Initiated Contact with Patient 07/03/14 1707     Chief Complaint  Patient presents with  . Gait Problem     (Consider location/radiation/quality/duration/timing/severity/associated sxs/prior Treatment) Patient is a 68 y.o. male presenting with general illness.  Illness Location:  Gait problem Quality:  Gait problem Severity:  Mild Onset quality:  Gradual Duration:  3 days Timing:  Constant Progression:  Worsening Chronicity:  New Context:  With a headache Associated symptoms: headaches   Associated symptoms: no abdominal pain, no congestion, no fever, no loss of consciousness, no shortness of breath, no vomiting and no wheezing     Past Medical History  Diagnosis Date  . CVA (cerebral infarction) 2000  . HTN (hypertension), benign   . Rheumatic fever     Age 23 or 68  . Tobacco abuse    Past Surgical History  Procedure Laterality Date  . Transthoracic echocardiogram  January 2004    Normal LV size and function. EF 55-65%.  . Carotid dopplers Bilateral 07/18/2013    Right subclavian 0-49%, left subclavian mild bilateral carotid stenosis..  . 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    Family History  Problem Relation Age of Onset  . Diabetes Mother   . Diabetes Father   . Heart attack Father    History  Substance Use Topics  . Smoking status: Current Every Day Smoker -- 1.00 packs/day for 50 years    Types: Cigarettes  . Smokeless tobacco: Not on file     Comment: Smokes < a pack per day now.  . Alcohol Use: No    Review of Systems  Constitutional: Positive for activity change. Negative for fever.  HENT: Negative for congestion.   Eyes: Negative for pain.  Respiratory: Negative for shortness of breath and wheezing.   Gastrointestinal: Negative for vomiting and abdominal pain.   Endocrine: Negative for polydipsia and polyuria.  Neurological: Positive for dizziness, speech difficulty and headaches. Negative for loss of consciousness, syncope and weakness.  All other systems reviewed and are negative.     Allergies  Penicillins  Home Medications   Prior to Admission medications   Medication Sig Start Date End Date Taking? Authorizing Provider  aspirin 81 MG tablet Take 81 mg by mouth daily.    Historical Provider, MD  lisinopril (PRINIVIL,ZESTRIL) 10 MG tablet Take 10 mg by mouth daily.    Historical Provider, MD  Omega-3 Fatty Acids (FISH OIL) 1000 MG CAPS Take by mouth.    Historical Provider, MD   BP 163/64 mmHg  Pulse 64  Temp(Src) 97.4 F (36.3 C) (Oral)  Resp 16  Ht 5\' 10"  (1.778 m)  Wt 145 lb (65.772 kg)  BMI 20.81 kg/m2  SpO2 99% Physical Exam  Constitutional: He is oriented to person, place, and time. He appears well-developed and well-nourished.  HENT:  Head: Normocephalic and atraumatic.  Eyes: Conjunctivae and EOM are normal. Pupils are equal, round, and reactive to light.  Neck: Normal range of motion.  Cardiovascular: Normal rate and regular rhythm.   Pulmonary/Chest: Effort normal and breath sounds normal.  Abdominal: Soft. He exhibits no distension. There is no tenderness.  Musculoskeletal: Normal range of motion. He exhibits no edema or tenderness.  Neurological: He is alert and oriented to person, place, and time. No cranial nerve deficit (persistent left  sided facial abnormalities 2/2 facial skin surgery).  Skin: Skin is warm and dry.  Nursing note and vitals reviewed.   ED Course  Procedures (including critical care time) Labs Review Labs Reviewed  CBC WITH DIFFERENTIAL - Abnormal; Notable for the following:    RBC 3.81 (*)    Hemoglobin 11.7 (*)    HCT 34.7 (*)    All other components within normal limits  COMPREHENSIVE METABOLIC PANEL - Abnormal; Notable for the following:    BUN 30 (*)    Albumin 3.4 (*)    Total  Bilirubin 0.2 (*)    GFR calc non Af Amer 84 (*)    All other components within normal limits  URINALYSIS, ROUTINE W REFLEX MICROSCOPIC - Abnormal; Notable for the following:    Hgb urine dipstick TRACE (*)    All other components within normal limits  MRSA PCR SCREENING  URINE MICROSCOPIC-ADD ON  BASIC METABOLIC PANEL  MAGNESIUM  CBC WITH DIFFERENTIAL    Imaging Review Dg Chest 2 View  07/03/2014   CLINICAL DATA:  Chest pain, brain metastasis on CT same day  EXAM: CHEST  2 VIEW  COMPARISON:  Chest radiograph 06/30/2014  FINDINGS: Normal cardiac silhouette. The left mid lung lesion measures 14 mm decreased from 22 mm on prior. There is airspace disease in lower lobe seen on lateral projection. No aggressive osseous lesion.  IMPRESSION: 1. The left lung lesion is smaller than comparison exam but still concerning for lung cancer given the brain lesions. Recommend CT thorax with contrast for further evaluation.  2.  Potential left lower lobe pneumonia.   Electronically Signed   By: Suzy Bouchard M.D.   On: 07/03/2014 18:20   Ct Head Wo Contrast  07/03/2014   ADDENDUM REPORT: 07/03/2014 18:25  ADDENDUM: A chest x-ray was performed following the head CT. The left lung lesion is decreased in size over 3 day interval. This raises the concern that the pulmonary lesions could be cerebral abscesses. Metastatic disease remains in differential. Recommend MRI of the brain with and without contrast to distinguish between infection and neoplasm.  Additional findings and recommendations conveyed toJASON Tyquan Carmickle on 07/03/2014 at18:24.   Electronically Signed   By: Suzy Bouchard M.D.   On: 07/03/2014 18:25   07/03/2014   CLINICAL DATA:  Gait abnormality  EXAM: CT HEAD WITHOUT CONTRAST  TECHNIQUE: Contiguous axial images were obtained from the base of the skull through the vertex without intravenous contrast.  COMPARISON:  Head CT 10/19/2005 on chest radiograph 07/30/2014  FINDINGS: There is round lesion in  right frontal lobe measuring 30 mm which has centrally low-attenuation. There is a large field of a the vasogenic edema surrounding this intraparenchymal lesion. There is a subfalcine herniation in the right frontal lobe with 7 cm of leftward midline shift. There is a second lesion in the right occipital lobe measuring 2.3 cm also with a large field of vasogenic edema.  There is compression of the lateral ventricles on the right. The quadrigeminal plate cistern is effaced on the left. There is 1e mm of midline shift centrally at the level of the ventricles. No intraparenchymal hemorrhage.  IMPRESSION: 1. Metastatic lesions in the right frontal lobe and right occipital lobe with extensive vasogenic edema. 2. Moderate leftward midline shift. 3. Effacement of the quadrigeminal plate cistern on the left. Subfalcine herniation anteriorly. 4. Patient has a lesion in the left lung on chest x-ray therefore this likely represents metastatic lung cancer. Critical Value/emergent results were called by  telephone at the time of interpretation on 07/03/2014 at 6:13 pm to Dr. Merrily Pew , who verbally acknowledged these results.  Electronically Signed: By: Suzy Bouchard M.D. On: 07/03/2014 18:14   Ct Chest W Contrast  07/03/2014   CLINICAL DATA:  68 year old male with lung lesion and brain mass. Evaluate for underlying malignancy.  EXAM: CT CHEST, ABDOMEN, AND PELVIS WITH CONTRAST  TECHNIQUE: Multidetector CT imaging of the chest, abdomen and pelvis was performed following the standard protocol during bolus administration of intravenous contrast.  CONTRAST:  131mL OMNIPAQUE IOHEXOL 300 MG/ML  SOLN  COMPARISON:  No priors.  FINDINGS: CT CHEST FINDINGS  Mediastinum: Heart size is normal. There is no significant pericardial fluid, thickening or pericardial calcification. There is atherosclerosis of the thoracic aorta, the great vessels of the mediastinum and the coronary arteries, including calcified atherosclerotic plaque  in the left main, left anterior descending and right coronary arteries. Left hilar lymphadenopathy measuring up to 19 x 27 mm (image 26 of series 201). No other mediastinal or right hilar adenopathy is noted. Esophagus is unremarkable in appearance. Separate origin of the left vertebral artery directly off the aortic arch (normal anatomical variant) incidentally noted.  Lungs/Pleura: Centered in the superior segment of the left lower lobe is a 4.3 x 3.7 x 4.0 cm thick-walled cavitary mass, highly suspicious for a cavitary neoplasm. Adjacent to this within the medial aspect of the left lower lobe there is extensive airspace consolidation and septal thickening. Focal pleuroparenchymal architectural distortion in the apex of the right upper lobe is strongly favored to be chronic post infectious or inflammatory scarring. No other suspicious appearing pulmonary nodules or masses are noted at this time. There is a background of moderate centrilobular emphysema with mild diffuse bronchial wall thickening. No pleural effusions. Small amount of scarring in the inferior segment of the lingula.  Musculoskeletal: There are no aggressive appearing lytic or blastic lesions noted in the visualized portions of the skeleton.  CT ABDOMEN AND PELVIS FINDINGS  Hepatobiliary: No focal cystic or solid hepatic lesions. No intra or extrahepatic biliary ductal dilatation. Gallbladder is normal in appearance.  Pancreas: Unremarkable.  Spleen: Unremarkable.  Adrenals/Urinary Tract: Extensive atrophy in the lower pole of the right kidney, presumably from prior infection/ inflammation. Sub cm low-attenuation lesions in the kidneys bilaterally too small to characterize, but favored to represent small cysts. In addition, there is a 1.6 cm simple cyst in the interpolar region of the left kidney. No hydroureteronephrosis. Urinary bladder is normal in appearance.  Stomach/Bowel: Normal appearance of the stomach. No pathologic dilatation of small  bowel or colon. Normal appendix (retrocecal).  Vascular/Lymphatic: Moderate to severe stenoses at the origins of the celiac axis and superior mesenteric arteries. Complete occlusion of the abdominal aorta immediately beneath the origin of the renal arteries. This occlusion extends into the pelvis where the common iliac arteries, internal iliac arteries and external iliac arteries are all completely occluded bilaterally. There is reconstitution of flow in the common femoral arteries bilaterally, and there are multiple collateral arteries noted in the pelvic sidewall bilaterally. IMA origin is also chronically occluded, although the distal aspect of the vessel is patent secondary to a collateral pathway which appears to involve a branch from the proximal superior mesenteric artery. No lymphadenopathy noted in the abdomen or pelvis.  Reproductive: Prostate gland and seminal vesicles are unremarkable in appearance.  Other: No significant volume of ascites.  No pneumoperitoneum.  Musculoskeletal: There are no aggressive appearing lytic or blastic lesions noted in  the visualized portions of the skeleton.  IMPRESSION: 1. 4.3 x 3.7 x 4.3 cm thick-walled cavitary mass in the superior segment of the left lower lobe with associated left hilar adenopathy, and previously diagnosed brain metastases. Findings are compatible with stage IV lung cancer (T2A, N1, M1b). 2. Extensive airspace consolidation in the medial aspect of the left lower lobe. Although this could in part related to postobstructive changes, the position of lesion argues against this. This may simply represent endobronchial spread of hemorrhage or secretions from the lesion, as lesion does appear to communicate with the airways. Alternatively, this could represent some lymphangitic spread of tumor, however, that is not strongly favored at this time. 3. 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 In addition, there is left main and 2 vessel coronary artery disease. 4. Additional incidental findings, as above.   Electronically Signed   By: Vinnie Langton M.D.   On: 07/03/2014 22:00   Ct Abdomen Pelvis W Contrast  07/03/2014   CLINICAL DATA:  68 year old male with lung lesion and brain mass. Evaluate for underlying malignancy.  EXAM: CT CHEST, ABDOMEN, AND PELVIS WITH CONTRAST  TECHNIQUE: Multidetector CT imaging of the chest, abdomen and pelvis was performed following the standard protocol during bolus administration of intravenous contrast.  CONTRAST:  110mL OMNIPAQUE IOHEXOL 300 MG/ML  SOLN  COMPARISON:  No priors.  FINDINGS: CT CHEST FINDINGS  Mediastinum: Heart size is normal. There is no significant pericardial fluid, thickening or pericardial calcification. There is atherosclerosis of the thoracic aorta, the great vessels of the mediastinum and the coronary arteries, including calcified atherosclerotic plaque in the left main, left anterior descending and right coronary arteries. Left hilar lymphadenopathy measuring up to 19 x 27 mm (image 26 of series 201). No other mediastinal or right hilar adenopathy is noted. Esophagus is unremarkable in appearance. Separate origin of the left vertebral artery directly off the aortic arch (normal anatomical variant) incidentally noted.  Lungs/Pleura: Centered in the superior segment of the left lower lobe is a 4.3 x 3.7 x 4.0 cm thick-walled cavitary mass, highly suspicious for a cavitary neoplasm. Adjacent to this within the medial aspect of the left lower lobe there is extensive airspace  consolidation and septal thickening. Focal pleuroparenchymal architectural distortion in the apex of the right upper lobe is strongly favored to be chronic post infectious or inflammatory scarring. No other suspicious appearing pulmonary nodules or masses are noted at this time. There is a background of moderate centrilobular emphysema with mild diffuse bronchial wall thickening. No pleural effusions. Small amount of scarring in the inferior segment of the lingula.  Musculoskeletal: There are no aggressive appearing lytic or blastic lesions noted in the visualized portions of the skeleton.  CT ABDOMEN AND PELVIS FINDINGS  Hepatobiliary: No focal cystic or solid hepatic lesions. No intra or extrahepatic biliary ductal dilatation. Gallbladder is normal in appearance.  Pancreas: Unremarkable.  Spleen: Unremarkable.  Adrenals/Urinary Tract: Extensive atrophy in the lower pole of the right kidney, presumably from prior infection/ inflammation. Sub cm low-attenuation lesions in the kidneys bilaterally too small to characterize, but favored to represent small cysts. In addition, there is a  1.6 cm simple cyst in the interpolar region of the left kidney. No hydroureteronephrosis. Urinary bladder is normal in appearance.  Stomach/Bowel: Normal appearance of the stomach. No pathologic dilatation of small bowel or colon. Normal appendix (retrocecal).  Vascular/Lymphatic: Moderate to severe stenoses at the origins of the celiac axis and superior mesenteric arteries. Complete occlusion of the abdominal aorta immediately beneath the origin of the renal arteries. This occlusion extends into the pelvis where the common iliac arteries, internal iliac arteries and external iliac arteries are all completely occluded bilaterally. There is reconstitution of flow in the common femoral arteries bilaterally, and there are multiple collateral arteries noted in the pelvic sidewall bilaterally. IMA origin is also chronically occluded, although  the distal aspect of the vessel is patent secondary to a collateral pathway which appears to involve a branch from the proximal superior mesenteric artery. No lymphadenopathy noted in the abdomen or pelvis.  Reproductive: Prostate gland and seminal vesicles are unremarkable in appearance.  Other: No significant volume of ascites.  No pneumoperitoneum.  Musculoskeletal: There are no aggressive appearing lytic or blastic lesions noted in the visualized portions of the skeleton.  IMPRESSION: 1. 4.3 x 3.7 x 4.3 cm thick-walled cavitary mass in the superior segment of the left lower lobe with associated left hilar adenopathy, and previously diagnosed brain metastases. Findings are compatible with stage IV lung cancer (T2A, N1, M1b). 2. Extensive airspace consolidation in the medial aspect of the left lower lobe. Although this could in part related to postobstructive changes, the position of lesion argues against this. This may simply represent endobronchial spread of hemorrhage or secretions from the lesion, as lesion does appear to communicate with the airways. Alternatively, this could represent some lymphangitic spread of tumor, however, that is not strongly favored at this time. 3. 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 In addition, there is left main and 2 vessel coronary artery disease. 4. Additional incidental findings, as above.   Electronically Signed   By: Vinnie Langton M.D.   On: 07/03/2014 22:00     EKG Interpretation   Date/Time:  Thursday July 03 2014 16:59:10  EST Ventricular Rate:  63 PR Interval:  162 QRS Duration: 130 QT Interval:  440 QTC Calculation: 450 R Axis:   68 Text Interpretation:  Sinus rhythm with occasional Premature ventricular  complexes Left bundle branch block Abnormal ECG Confirmed by Hazle Coca  [phone]) on 07/03/2014 9:09:06 PM      MDM   Final diagnoses:  Gait abnormality  Headache    68 yo M here with a few days of worsening headache, ataxia and hypertension. Seen by PCP and BP meds adjusted, CXR Monday suspicious for mass. Here, exam as above, difficulty ambulating from chair to bed so didn't attempt walking. Two large masses on head CT, likely pulmonary source. nsg consulted over phone and will follow in house, admitted to medicine.     Merrily Pew, MD 07/03/14 [place], MD 07/03/14 [id number]

## 2014-07-03 NOTE — Progress Notes (Signed)
I was called about this pt's CT head which shows what I suspect is lesions compatible with lung cancer met to brain, esp given the CXR findings. MRI brain ordered to r/o abscess vs met vs other. Given that the mets are multiple, he should be admitted to medicine for metastatic w/u (CT C/A/P, bone scan, etc) to  determine tumor burden. May need biopsy of systemic dz to determine large cell (treatment palliative vs chemo/XRT +/- surgery) vs small cell (XRT). Decadron may help with edema and potentially improve symptoms. I will see in formal consultation during hospital stay once further studies have been completed and treatment plan can be discussed.

## 2014-07-03 NOTE — H&P (Signed)
Date: 07/03/2014               Patient Name:  Anthony Lopez MRN: [id number]  DOB: February 28, 1946 Age / Sex: 68 y.o., male   PCP: Aletha Halim, PA-C         Medical Service: Internal Medicine Teaching Service         Attending Physician: Dr. Annia Belt, MD    First Contact: Dr. Dellia Nims Pager: [phone]  Second Contact: Dr. Natasha Bence Pager: [phone]       After Hours (After 5p/  First Contact Pager: [phone]  weekends / holidays): Second Contact Pager: [phone]   Chief Complaint: confusion and instability walking over the past week  History of Present Illness: Anthony Lopez is a 68 yo right-handed man with a history of Mohs surgery on his right face in 2007 (basal cell carcinoma per pt), CVA in 2000, hypertension and 125 pack year history of smoking who presented to the ED today for hemoptysis, headache, confusion, unstable gait and difficulty getting dressed. Over the past two weeks, he has noticed blood when he coughs. Then, about a week ago, he started to have a right frontal headache just above his right eye that would come and go. The pain was pounding, did not move and reached a 10/10 in intensity at times. He initially visited his PCP for his symptoms last Friday. During this appointment, the patient was started on pain medication. Over the weekend, his symptoms all progressed, and his wife took him off of the medication. He returned to his PCP on Monday and a mass was visualized on chest XR. He was started on doxycycline and provided with a plan for investigation of the mass. However, his symptoms persisted; his headache was present for most of the day, he seemed confused and slightly slower than usual to his family, and he had difficulty taking off and putting on his clothes and navigating in familiar places. He would also start to fall to the side when standing and needed help to move. In fact, he had one fall during which he hit the back of his head when he misjudged  the position of a chair and went to sit in it. He is unsure whether he has had fever or chills. He has had no other falls, dizziness, nausea, vomiting, chest pain or shortness of breath.  In the ED, CT head revealed metastatic lesions in the right frontal lobe and right occipital lobe with extensive vasogenic edema.  Meds:                     Current Outpatient Prescriptions  Medication Sig Dispense Refill  . lisinopril (PRINIVIL,ZESTRIL) 10 MG tablet Take 10 mg by mouth daily.    .      .        Allergies: Allergies as of 07/03/2014 - Review Complete 07/03/2014  Allergen Reaction Noted  . Doxycycline  07/03/2014  . Penicillins Other (See Comments) 08/06/2013   Past Medical History  Diagnosis Date  . CVA (cerebral infarction) 2000  . HTN (hypertension), benign   . Rheumatic fever     Age 45 or 53  . Tobacco abuse    Past Surgical History  Procedure Laterality Date  . Transthoracic echocardiogram  January 2004    Normal LV size and function. EF 55-65%.  . Carotid dopplers Bilateral 07/18/2013    Right subclavian 0-49%, left subclavian mild bilateral carotid stenosis..  . 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    Family History  Problem Relation Age of Onset  . Diabetes Mother   . Diabetes Father   . Heart attack Father   Skin cancer              Mother Hypertension              Daughter History   Social History  . Marital Status: Married    Spouse Name: N/A    Number of Children: N/A  . Years of Education: N/A   Occupational History  . Not on file.   Social History Main Topics  . Smoking status: Current Every Day Smoker -- 2.50 ppd for many years, now smokes 1ppd; started smoking 50 years ago    Types: Cigarettes  . Smokeless tobacco: Not on file     Comment: Smokes < a pack per day now.  . Alcohol Use: No  . Drug Use: Not on file  . Sexual Activity:  Not on file   Other Topics Concern  . 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    Review of Systems: General: weight loss, unsure how much but clothes are looser, good appetite Skin: no rashes or lesions HEENT: headaches, no changes in vision, right face scar s/p Mohs surgery in ~2007 with resultant facial skew to the right Cardiac: no chest pain, no palpitaitons Respiratory: no shortness of breath GI: no nausea, vomiting, changes in BMs Urinary: no changes in urination Msk: no lower or upper extremity pain   Physical Exam: Blood pressure 171/70, pulse 58, temperature 97.7 F (36.5 C), temperature source Oral, resp. rate 14, height 5\' 10"  (1.778 m), weight 132 lb 7.9 oz (60.1 kg), SpO2 99 %. Weight shows 13 lb decrease since 07/2013 (where he was 145 lb) Appearance: in NAD, but appears tired and overwhelmed on interview, wife and daughter at bedside HEENT: AT/[place], PERRL, EOMi, carotid bruits b/l Heart: RRR, normal S1S2, no MRG Lungs: CTAB, no WRR Abdomen: BS+, soft, nontender Extremities: no tenderness or edema b/l Neurologic: A&Ox3, somewhat slow to answer questions. CN II-XII intact, strength 5/5 throughout, sensation full throughout, 1+ reflexes throughout, downgoing Bab[place]kis, 2 point discrimination intact, coordination slowed but intact on FNF and RAM, possible + pronator drift (patient had some limitation due to pulse ox and tele cords), calculation intact (there are 5 quarters in $1.25), speech intact "no ifs ands or buts", difficulty following multi-step directions. Gait was not assessed. Skin: large scar on right face with no other lesions or rashes   Lab results: Basic Metabolic Panel:  Recent Labs  07/03/14 1815  NA 139  K 4.3  CL 104  CO2 26  GLUCOSE 81  BUN 30*  CREATININE 0.98   CALCIUM 9.1   Liver Function Tests:  Recent Labs  07/03/14 1815  AST 11  ALT 9  ALKPHOS 57  BILITOT 0.2*  PROT 6.4  ALBUMIN 3.4*   CBC:  Recent Labs  07/03/14 1815  WBC 7.8  NEUTROABS 3.7  HGB 11.7*  HCT 34.7*  MCV 91.1  PLT 151   Urinalysis:  Recent Labs  07/03/14 2038  COLORURINE YELLOW  LABSPEC 1.026  PHURINE 6.5  GLUCOSEU NEGATIVE  HGBUR  TRACE*  BILIRUBINUR NEGATIVE  KETONESUR NEGATIVE  PROTEINUR NEGATIVE  UROBILINOGEN 1.0  NITRITE NEGATIVE  LEUKOCYTESUR NEGATIVE    Imaging results:  Dg Chest 2 View  07/03/2014   CLINICAL DATA:  Chest pain, brain metastasis on CT same day  EXAM: CHEST  2 VIEW  COMPARISON:  Chest radiograph 06/30/2014  FINDINGS: Normal cardiac silhouette. The left mid lung lesion measures 14 mm decreased from 22 mm on prior. There is airspace disease in lower lobe seen on lateral projection. No aggressive osseous lesion.  IMPRESSION: 1. The left lung lesion is smaller than comparison exam but still concerning for lung cancer given the brain lesions. Recommend CT thorax with contrast for further evaluation.  2.  Potential left lower lobe pneumonia.   Electronically Signed   By: Suzy Bouchard M.D.   On: 07/03/2014 18:20   Ct Head Wo Contrast  07/03/2014   ADDENDUM REPORT: 07/03/2014 18:25  ADDENDUM: A chest x-ray was performed following the head CT. The left lung lesion is decreased in size over 3 day interval. This raises the concern that the pulmonary lesions could be cerebral abscesses. Metastatic disease remains in differential. Recommend MRI of the brain with and without contrast to distinguish between infection and neoplasm.  Additional findings and recommendations conveyed toJASON MESNER on 07/03/2014 at18:24.   Electronically Signed   By: Suzy Bouchard M.D.   On: 07/03/2014 18:25   07/03/2014   CLINICAL DATA:  Gait abnormality  EXAM: CT HEAD WITHOUT CONTRAST  TECHNIQUE: Contiguous axial images were obtained from the base of the  skull through the vertex without intravenous contrast.  COMPARISON:  Head CT 10/19/2005 on chest radiograph 07/30/2014  FINDINGS: There is round lesion in right frontal lobe measuring 30 mm which has centrally low-attenuation. There is a large field of a the vasogenic edema surrounding this intraparenchymal lesion. There is a subfalcine herniation in the right frontal lobe with 7 cm of leftward midline shift. There is a second lesion in the right occipital lobe measuring 2.3 cm also with a large field of vasogenic edema.  There is compression of the lateral ventricles on the right. The quadrigeminal plate cistern is effaced on the left. There is 1e mm of midline shift centrally at the level of the ventricles. No intraparenchymal hemorrhage.  IMPRESSION: 1. Metastatic lesions in the right frontal lobe and right occipital lobe with extensive vasogenic edema. 2. Moderate leftward midline shift. 3. Effacement of the quadrigeminal plate cistern on the left. Subfalcine herniation anteriorly. 4. Patient has a lesion in the left lung on chest x-ray therefore this likely represents metastatic lung cancer. Critical Value/emergent results were called by telephone at the time of interpretation on 07/03/2014 at 6:13 pm to Dr. Merrily Pew , who verbally acknowledged these results.  Electronically Signed: By: Suzy Bouchard M.D. On: 07/03/2014 18:14    Other results: EKG: Rate 63, NSR, occasional PVCs, LBBB which is a known finding (stable from 07/2013 EKG)  Assessment & Plan by Problem: Active Problems:   Tobacco abuse   HTN (hypertension)   Brain edema   Brain mass   Lesion of lung  Mr. Bazzi is a 68 yo man with a history of possible basal cell carcinoma that was removed in 2007 and a 125 pack year history of smoking who has been found on imaging to have a cavitary lung mass in the left lower lobe that is suggestive of stage IV lung cancer and brain lesions in the right frontal and right occipital lobes  that  are likely metastatic disease. It is most likely that his primary is in the lung, less likely the skin, given these findings and his history.  Brain Masses: Lesions in the right frontal and right occipital lobe visualized on CT head that are likely metastatic disease. Corresponding vasogenic edema with leftward midline shift. This is likely combining to cause his headache, confusion, unstable gait, difficulty getting dressed and difficulty following multi-step directions on exam. - MRI brain w contrast to rule out abscess vs mets vs other pending - Decadron 4 mg IV q6 hours to help with edema and potentially improve symptoms - PRN tylenol for pain - PRN zofran for nausea - Neuro checks - Seizure precautions - Appreciate Neurosurgery note and recommendations; neurosurgery will evaluate formally later in hospital stay  Left Lung Mass: Left lung lesion seen on CXR 4 days ago; again visualized today on CXR, smaller; on CT chest, now suggestive of stage IV lung cancer (T2A, N1, M1b) with left hilar adenopathy. This is likely the cause of his hemoptysis. - Consider bronchoscopy for lung biopsy; could help determine large cell (which would be treated with palliative care vs chemo/XRT/possible surgery) vs small cell (XRT)   Malignancy Workup:  - FOBT - Consider bone scan  - Attempt to track down dermatology notes from 2007 Mohs; family unsure of where Mohs was done, but subsequent facial reconstructive surgery was at [facility]. Removal of a March ARB as per patient.  Airspace Consolidation on Medial Aspect of LLL: Initially thought to represent pneumonia on outpatient CXR, then possible post-obstructive pneumonia on CXR today. CT more suggestive of endobronchial spread of hemorrhage or secretions of the lesion vs lymphangitic spread. Afebrile, no leukocytosis, no URI symptoms. - Hold treatment of any sort of infection in that the patient has no symptoms  Hypertension: Recently started on lisinopril 10 mg by  mouth daily. It is advantageous to avoid rapid blood pressure shifts in patients with increased ICP. Patient has been in the 140s to 170s over 70s to 33s in the hospital. - Resume home lisinopril  Tobacco Abuse: Patient is interested in quitting his cigarette use. - Cessation counseling - Nicotine patch ordered 21 mg  Diet: - HH diet  DVT Ppx:  - [place] lovenox  - SCDs  Code Status: Discussed with patient and family at bedside; FULL CODE  Dispo: Disposition is deferred at this time, awaiting improvement of current medical problems. Anticipated discharge in approximately 4 day(s).   The patient does have a current PCP Aletha Halim, PA-C) and does not need an [facility] hospital follow-up appointment after discharge.  The patient does not have transportation limitations that hinder transportation to clinic appointments.  Signed: Drucilla Schmidt, MD 07/03/2014, 7:48 PM

## 2014-07-03 NOTE — ED Notes (Addendum)
MD at bedside. Offered to call chaplin for family but they states that they are going to their preacher.

## 2014-07-03 NOTE — ED Notes (Addendum)
Pt wife states that she took husband to see pcp d/t irregular gait and headache on Fri and Mon.  PCP increased pt bp meds, but wife found gait was worsening.  Hx of cva.

## 2014-07-03 NOTE — ED Notes (Signed)
Patient transported to CT 

## 2014-07-04 ENCOUNTER — Encounter (HOSPITAL_COMMUNITY): Payer: Self-pay | Admitting: Neurological Surgery

## 2014-07-04 ENCOUNTER — Inpatient Hospital Stay (HOSPITAL_COMMUNITY): Payer: Medicare HMO

## 2014-07-04 DIAGNOSIS — I10 Essential (primary) hypertension: Secondary | ICD-10-CM

## 2014-07-04 DIAGNOSIS — G936 Cerebral edema: Secondary | ICD-10-CM

## 2014-07-04 DIAGNOSIS — Z720 Tobacco use: Secondary | ICD-10-CM

## 2014-07-04 DIAGNOSIS — I2510 Atherosclerotic heart disease of native coronary artery without angina pectoris: Secondary | ICD-10-CM

## 2014-07-04 DIAGNOSIS — C7931 Secondary malignant neoplasm of brain: Principal | ICD-10-CM

## 2014-07-04 DIAGNOSIS — I739 Peripheral vascular disease, unspecified: Secondary | ICD-10-CM

## 2014-07-04 DIAGNOSIS — J181 Lobar pneumonia, unspecified organism: Secondary | ICD-10-CM

## 2014-07-04 DIAGNOSIS — C3432 Malignant neoplasm of lower lobe, left bronchus or lung: Secondary | ICD-10-CM

## 2014-07-04 LAB — BASIC METABOLIC PANEL
Anion gap: 9 (ref 5–15)
BUN: 27 mg/dL — ABNORMAL HIGH (ref 6–23)
CO2: 23 mEq/L (ref 19–32)
CREATININE: 0.86 mg/dL (ref 0.50–1.35)
Calcium: 8.9 mg/dL (ref 8.4–10.5)
Chloride: 101 mEq/L (ref 96–112)
GFR calc non Af Amer: 88 mL/min — ABNORMAL LOW (ref >90)
GLUCOSE: 194 mg/dL — AB (ref 70–99)
Potassium: 4.2 mEq/L (ref 3.7–5.3)
Sodium: 133 mEq/L — ABNORMAL LOW (ref 137–147)

## 2014-07-04 LAB — CBC WITH DIFFERENTIAL/PLATELET
Basophils Absolute: 0 10*3/uL (ref 0.0–0.1)
Basophils Relative: 0 % (ref 0–1)
EOS ABS: 0 10*3/uL (ref 0.0–0.7)
Eosinophils Relative: 0 % (ref 0–5)
HCT: 33.5 % — ABNORMAL LOW (ref 39.0–52.0)
Hemoglobin: 11.4 g/dL — ABNORMAL LOW (ref 13.0–17.0)
LYMPHS ABS: 1.6 10*3/uL (ref 0.7–4.0)
LYMPHS PCT: 22 % (ref 12–46)
MCH: 30.4 pg (ref 26.0–34.0)
MCHC: 34 g/dL (ref 30.0–36.0)
MCV: 89.3 fL (ref 78.0–100.0)
MONO ABS: 0 10*3/uL — AB (ref 0.1–1.0)
MONOS PCT: 1 % — AB (ref 3–12)
Neutro Abs: 5.8 10*3/uL (ref 1.7–7.7)
Neutrophils Relative %: 77 % (ref 43–77)
PLATELETS: 150 10*3/uL (ref 150–400)
RBC: 3.75 MIL/uL — ABNORMAL LOW (ref 4.22–5.81)
RDW: 12.6 % (ref 11.5–15.5)
WBC: 7.5 10*3/uL (ref 4.0–10.5)

## 2014-07-04 LAB — MAGNESIUM: Magnesium: 1.9 mg/dL (ref 1.5–2.5)

## 2014-07-04 MED ORDER — SODIUM CHLORIDE 0.9 % IV BOLUS (SEPSIS)
1000.0000 mL | Freq: Once | INTRAVENOUS | Status: AC
  Administered 2014-07-04: 1000 mL via INTRAVENOUS

## 2014-07-04 MED ORDER — SODIUM CHLORIDE 0.9 % IV SOLN
INTRAVENOUS | Status: DC | PRN
  Administered 2014-07-04: 12:00:00 via INTRAVENOUS

## 2014-07-04 MED ORDER — GADOBENATE DIMEGLUMINE 529 MG/ML IV SOLN
13.0000 mL | Freq: Once | INTRAVENOUS | Status: AC | PRN
  Administered 2014-07-04: 13 mL via INTRAVENOUS

## 2014-07-04 MED ORDER — NICOTINE 21 MG/24HR TD PT24: 21.0000 mg | MEDICATED_PATCH | TRANSDERMAL | Status: DC

## 2014-07-04 MED ORDER — NICOTINE 21 MG/24HR TD PT24
21.0000 mg | MEDICATED_PATCH | TRANSDERMAL | Status: DC
  Administered 2014-07-04 – 2014-07-07 (×3): 21 mg via TRANSDERMAL
  Filled 2014-07-04 (×4): qty 1

## 2014-07-04 NOTE — Progress Notes (Addendum)
Spoke to internal medicine MD Ahmed, MRI results read to MD.  He stated to hold off on lovenox for now and it will be addressed during rounds in AM.  I will pass this on in to PM RN. NO order received to dc lovenox order.

## 2014-07-04 NOTE — Consult Note (Signed)
Reason for Consult: Multiple brain masses Referring Physician: EDP  Anthony Lopez is an 68 y.o. male.   HPI:  68 year old gentleman with a history of tobacco abuse who was admitted last evening with a history of progressive headache and difficulty with gait. He has been walking into objects and bumping into objects. He has had to hold onto the wall walking down the hall. Denies focal numbness tingling or weakness. Denies focal visual changes. CT scan of the head showed at least 2 right-sided intracerebral lesions and his chest CT showed what looks like a cavitary lung mass. He has been started on Decadron and that has helped somewhat with symptom control. He has had previous bilateral CVAs. According to the family the deficits cleared.  Past Medical History  Diagnosis Date  . CVA (cerebral infarction) 2000  . HTN (hypertension), benign   . Rheumatic fever     Age 39 or 46  . Tobacco abuse     Past Surgical History  Procedure Laterality Date  . Transthoracic echocardiogram  January 2004    Normal LV size and function. EF 55-65%.  . Carotid dopplers Bilateral 07/18/2013    Right subclavian 0-49%, left subclavian mild bilateral carotid stenosis..  . 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   . Mohs surgery      h/o NMSC (scc or bcc)    Allergies  Allergen Reactions  . Doxycycline     Stay asleep   . Penicillins Other (See Comments)    unspecified    History  Substance Use Topics  . Smoking status: Current Every Day Smoker -- 1.00 packs/day for 50 years    Types: Cigarettes  . Smokeless tobacco: Not on file     Comment: Smokes < a pack per day now.  . Alcohol Use: No    Family History  Problem Relation Age of Onset  . Diabetes Mother   . Diabetes Father   . Heart attack Father   . Skin cancer Mother   . Hypertension Daughter      Review of Systems  Positive ROS: As  above, no fevers or chills  All other systems have been reviewed and were otherwise negative with the exception of those mentioned in the HPI and as above.  Objective: Vital signs in last 24 hours: Temp:  [97.4 F (36.3 C)-98.3 F (36.8 C)] 97.5 F (36.4 C) (11/27 0730) Pulse Rate:  [47-64] 64 (11/27 0730) Resp:  [9-20] 13 (11/27 0730) BP: (97-171)/(38-135) 134/59 mmHg (11/27 0947) SpO2:  [95 %-100 %] 100 % (11/27 0730) Weight:  [132 lb 7.9 oz (60.1 kg)-145 lb (65.772 kg)] 133 lb 13.1 oz (60.7 kg) (11/27 0500)  General Appearance: Alert, cooperative, no distress, appears stated age Head: Normocephalic, without obvious abnormality, atraumatic Eyes: PERRL, conjunctiva/corneas clear, EOM's intact   Neck: Supple, symmetrical, trachea midline Back: Symmetric, no curvature, ROM normal, no CVA tenderness Lungs:  respirations unlabored Heart: Regular rate and rhythm  NEUROLOGIC:   Mental status: A&O x4, no aphasia, good attention span Motor Exam - grossly normal, normal tone and bulk, no pronator drift Sensory Exam - grossly normal Reflexes: symmetric, no pathologic reflexes Gait - not tested Balance - not tested Cranial Nerves: I: smell Not tested  II: visual acuity  OS: na    OD: na  II: visual fields Full to confrontation To in formal testing   II: pupils Equal, round,  reactive to light  III,VII: ptosis None  III,IV,VI: extraocular muscles  Full ROM  V: mastication Normal  V: facial light touch sensation  Normal  V,VII: corneal reflex  Present  VII: facial muscle function - upper  Normal  VII: facial muscle function - lower Normal, his left lower face seems to droops somewhat but on testing it seems to function okay   VIII: hearing Not tested  IX: soft palate elevation  Normal  IX,X: gag reflex Present  XI: trapezius strength  5/5  XI: sternocleidomastoid strength 5/5  XI: neck flexion strength  5/5  XII: tongue strength  Normal    Data Review Lab Results  Component  Value Date   WBC 7.5 07/04/2014   HGB 11.4* 07/04/2014   HCT 33.5* 07/04/2014   MCV 89.3 07/04/2014   PLT 150 07/04/2014   Lab Results  Component Value Date   NA 133* 07/04/2014   K 4.2 07/04/2014   CL 101 07/04/2014   CO2 23 07/04/2014   BUN 27* 07/04/2014   CREATININE 0.86 07/04/2014   GLUCOSE 194* 07/04/2014   Lab Results  Component Value Date   INR 0.95 11/16/2009    Radiology: Dg Chest 2 View  07/03/2014   CLINICAL DATA:  Chest pain, brain metastasis on CT same day  EXAM: CHEST  2 VIEW  COMPARISON:  Chest radiograph 06/30/2014  FINDINGS: Normal cardiac silhouette. The left mid lung lesion measures 14 mm decreased from 22 mm on prior. There is airspace disease in lower lobe seen on lateral projection. No aggressive osseous lesion.  IMPRESSION: 1. The left lung lesion is smaller than comparison exam but still concerning for lung cancer given the brain lesions. Recommend CT thorax with contrast for further evaluation.  2.  Potential left lower lobe pneumonia.   Electronically Signed   By: Suzy Bouchard M.D.   On: 07/03/2014 18:20   Ct Head Wo Contrast  07/03/2014   ADDENDUM REPORT: 07/03/2014 18:25  ADDENDUM: A chest x-ray was performed following the head CT. The left lung lesion is decreased in size over 3 day interval. This raises the concern that the pulmonary lesions could be cerebral abscesses. Metastatic disease remains in differential. Recommend MRI of the brain with and without contrast to distinguish between infection and neoplasm.  Additional findings and recommendations conveyed toJASON MESNER on 07/03/2014 at18:24.   Electronically Signed   By: Suzy Bouchard M.D.   On: 07/03/2014 18:25   07/03/2014   CLINICAL DATA:  Gait abnormality  EXAM: CT HEAD WITHOUT CONTRAST  TECHNIQUE: Contiguous axial images were obtained from the base of the skull through the vertex without intravenous contrast.  COMPARISON:  Head CT 10/19/2005 on chest radiograph 07/30/2014  FINDINGS: There  is round lesion in right frontal lobe measuring 30 mm which has centrally low-attenuation. There is a large field of a the vasogenic edema surrounding this intraparenchymal lesion. There is a subfalcine herniation in the right frontal lobe with 7 cm of leftward midline shift. There is a second lesion in the right occipital lobe measuring 2.3 cm also with a large field of vasogenic edema.  There is compression of the lateral ventricles on the right. The quadrigeminal plate cistern is effaced on the left. There is 1e mm of midline shift centrally at the level of the ventricles. No intraparenchymal hemorrhage.  IMPRESSION: 1. Metastatic lesions in the right frontal lobe and right occipital lobe with extensive vasogenic edema. 2. Moderate leftward midline shift. 3. Effacement of the quadrigeminal plate  cistern on the left. Subfalcine herniation anteriorly. 4. Patient has a lesion in the left lung on chest x-ray therefore this likely represents metastatic lung cancer. Critical Value/emergent results were called by telephone at the time of interpretation on 07/03/2014 at 6:13 pm to Dr. Merrily Pew , who verbally acknowledged these results.  Electronically Signed: By: Suzy Bouchard M.D. On: 07/03/2014 18:14   Ct Chest W Contrast  07/03/2014   CLINICAL DATA:  68 year old male with lung lesion and brain mass. Evaluate for underlying malignancy.  EXAM: CT CHEST, ABDOMEN, AND PELVIS WITH CONTRAST  TECHNIQUE: Multidetector CT imaging of the chest, abdomen and pelvis was performed following the standard protocol during bolus administration of intravenous contrast.  CONTRAST:  152mL OMNIPAQUE IOHEXOL 300 MG/ML  SOLN  COMPARISON:  No priors.  FINDINGS: CT CHEST FINDINGS  Mediastinum: Heart size is normal. There is no significant pericardial fluid, thickening or pericardial calcification. There is atherosclerosis of the thoracic aorta, the great vessels of the mediastinum and the coronary arteries, including calcified  atherosclerotic plaque in the left main, left anterior descending and right coronary arteries. Left hilar lymphadenopathy measuring up to 19 x 27 mm (image 26 of series 201). No other mediastinal or right hilar adenopathy is noted. Esophagus is unremarkable in appearance. Separate origin of the left vertebral artery directly off the aortic arch (normal anatomical variant) incidentally noted.  Lungs/Pleura: Centered in the superior segment of the left lower lobe is a 4.3 x 3.7 x 4.0 cm thick-walled cavitary mass, highly suspicious for a cavitary neoplasm. Adjacent to this within the medial aspect of the left lower lobe there is extensive airspace consolidation and septal thickening. Focal pleuroparenchymal architectural distortion in the apex of the right upper lobe is strongly favored to be chronic post infectious or inflammatory scarring. No other suspicious appearing pulmonary nodules or masses are noted at this time. There is a background of moderate centrilobular emphysema with mild diffuse bronchial wall thickening. No pleural effusions. Small amount of scarring in the inferior segment of the lingula.  Musculoskeletal: There are no aggressive appearing lytic or blastic lesions noted in the visualized portions of the skeleton.  CT ABDOMEN AND PELVIS FINDINGS  Hepatobiliary: No focal cystic or solid hepatic lesions. No intra or extrahepatic biliary ductal dilatation. Gallbladder is normal in appearance.  Pancreas: Unremarkable.  Spleen: Unremarkable.  Adrenals/Urinary Tract: Extensive atrophy in the lower pole of the right kidney, presumably from prior infection/ inflammation. Sub cm low-attenuation lesions in the kidneys bilaterally too small to characterize, but favored to represent small cysts. In addition, there is a 1.6 cm simple cyst in the interpolar region of the left kidney. No hydroureteronephrosis. Urinary bladder is normal in appearance.  Stomach/Bowel: Normal appearance of the stomach. No pathologic  dilatation of small bowel or colon. Normal appendix (retrocecal).  Vascular/Lymphatic: Moderate to severe stenoses at the origins of the celiac axis and superior mesenteric arteries. Complete occlusion of the abdominal aorta immediately beneath the origin of the renal arteries. This occlusion extends into the pelvis where the common iliac arteries, internal iliac arteries and external iliac arteries are all completely occluded bilaterally. There is reconstitution of flow in the common femoral arteries bilaterally, and there are multiple collateral arteries noted in the pelvic sidewall bilaterally. IMA origin is also chronically occluded, although the distal aspect of the vessel is patent secondary to a collateral pathway which appears to involve a branch from the proximal superior mesenteric artery. No lymphadenopathy noted in the abdomen or pelvis.  Reproductive:  Prostate gland and seminal vesicles are unremarkable in appearance.  Other: No significant volume of ascites.  No pneumoperitoneum.  Musculoskeletal: There are no aggressive appearing lytic or blastic lesions noted in the visualized portions of the skeleton.  IMPRESSION: 1. 4.3 x 3.7 x 4.3 cm thick-walled cavitary mass in the superior segment of the left lower lobe with associated left hilar adenopathy, and previously diagnosed brain metastases. Findings are compatible with stage IV lung cancer (T2A, N1, M1b). 2. Extensive airspace consolidation in the medial aspect of the left lower lobe. Although this could in part related to postobstructive changes, the position of lesion argues against this. This may simply represent endobronchial spread of hemorrhage or secretions from the lesion, as lesion does appear to communicate with the airways. Alternatively, this could represent some lymphangitic spread of tumor, however, that is not strongly favored at this time. 3. 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 In addition, there is left main and 2 vessel coronary artery disease. 4. Additional incidental findings, as above.   Electronically Signed   By: Vinnie Langton M.D.   On: 07/03/2014 22:00   Ct Abdomen Pelvis W Contrast  07/03/2014   CLINICAL DATA:  68 year old male with lung lesion and brain mass. Evaluate for underlying malignancy.  EXAM: CT CHEST, ABDOMEN, AND PELVIS WITH CONTRAST  TECHNIQUE: Multidetector CT imaging of the chest, abdomen and pelvis was performed following the standard protocol during bolus administration of intravenous contrast.  CONTRAST:  168mL OMNIPAQUE IOHEXOL 300 MG/ML  SOLN  COMPARISON:  No priors.  FINDINGS: CT CHEST FINDINGS  Mediastinum: Heart size is normal. There is no significant pericardial fluid, thickening or pericardial calcification. There is atherosclerosis of the thoracic aorta, the great vessels of the mediastinum and the coronary arteries, including calcified atherosclerotic plaque in the left main, left anterior descending and right coronary arteries. Left hilar lymphadenopathy measuring up to 19 x 27 mm (image 26 of series 201). No other mediastinal or right hilar adenopathy is noted. Esophagus is unremarkable in appearance. Separate origin of the left vertebral artery directly off the aortic arch (normal anatomical variant) incidentally noted.  Lungs/Pleura: Centered in the superior segment of the left lower lobe is a 4.3 x 3.7 x 4.0 cm thick-walled cavitary mass, highly suspicious for a cavitary neoplasm. Adjacent to this within the medial aspect of the left lower lobe there is  extensive airspace consolidation and septal thickening. Focal pleuroparenchymal architectural distortion in the apex of the right upper lobe is strongly favored to be chronic post infectious or inflammatory scarring. No other suspicious appearing pulmonary nodules or masses are noted at this time. There is a background of moderate centrilobular emphysema with mild diffuse bronchial wall thickening. No pleural effusions. Small amount of scarring in the inferior segment of the lingula.  Musculoskeletal: There are no aggressive appearing lytic or blastic lesions noted in the visualized portions of the skeleton.  CT ABDOMEN AND PELVIS FINDINGS  Hepatobiliary: No focal cystic or solid hepatic lesions. No intra or extrahepatic biliary ductal dilatation. Gallbladder is normal in appearance.  Pancreas: Unremarkable.  Spleen: Unremarkable.  Adrenals/Urinary Tract: Extensive atrophy in the lower pole  of the right kidney, presumably from prior infection/ inflammation. Sub cm low-attenuation lesions in the kidneys bilaterally too small to characterize, but favored to represent small cysts. In addition, there is a 1.6 cm simple cyst in the interpolar region of the left kidney. No hydroureteronephrosis. Urinary bladder is normal in appearance.  Stomach/Bowel: Normal appearance of the stomach. No pathologic dilatation of small bowel or colon. Normal appendix (retrocecal).  Vascular/Lymphatic: Moderate to severe stenoses at the origins of the celiac axis and superior mesenteric arteries. Complete occlusion of the abdominal aorta immediately beneath the origin of the renal arteries. This occlusion extends into the pelvis where the common iliac arteries, internal iliac arteries and external iliac arteries are all completely occluded bilaterally. There is reconstitution of flow in the common femoral arteries bilaterally, and there are multiple collateral arteries noted in the pelvic sidewall bilaterally. IMA origin is also chronically  occluded, although the distal aspect of the vessel is patent secondary to a collateral pathway which appears to involve a branch from the proximal superior mesenteric artery. No lymphadenopathy noted in the abdomen or pelvis.  Reproductive: Prostate gland and seminal vesicles are unremarkable in appearance.  Other: No significant volume of ascites.  No pneumoperitoneum.  Musculoskeletal: There are no aggressive appearing lytic or blastic lesions noted in the visualized portions of the skeleton.  IMPRESSION: 1. 4.3 x 3.7 x 4.3 cm thick-walled cavitary mass in the superior segment of the left lower lobe with associated left hilar adenopathy, and previously diagnosed brain metastases. Findings are compatible with stage IV lung cancer (T2A, N1, M1b). 2. Extensive airspace consolidation in the medial aspect of the left lower lobe. Although this could in part related to postobstructive changes, the position of lesion argues against this. This may simply represent endobronchial spread of hemorrhage or secretions from the lesion, as lesion does appear to communicate with the airways. Alternatively, this could represent some lymphangitic spread of tumor, however, that is not strongly favored at this time. 3. 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 In addition, there is left main and 2 vessel coronary artery disease. 4. Additional incidental findings, as above.   Electronically Signed   By: Vinnie Langton M.D.   On: 07/03/2014 22:00     Assessment/Plan:  68 year old gentleman with a  history of tobacco abuse who presents with a cavitary lung mass into right sided brain masses. This is most consistent with cerebral metastasis from lung cancer. I doubt abscess. He is awaiting MRI of the brain with and without contrast. Continue Decadron. It is unknown whether or not he will be a candidate for surgical resection of the lesion. MRI will help determine the number of lesions and their accessibility.   Anthony Lopez S 07/04/2014 11:05 AM

## 2014-07-04 NOTE — Consult Note (Signed)
Radiation Oncology         [phone] ________________________________  Initial inpatient Consultation  Name: Anthony Lopez  MRN: [id number]  Date: 07/03/2014  DOB: [birth year]  HK:[id number],GLOVFIE, PA-C  No ref. provider found   REFERRING PHYSICIAN: Murriel Hopper, MD  DIAGNOSIS: 68 yo gentleman with probable brain metatases from cavitary lung mass, pending tissue diagnosis   ICD-9-CM ICD-10-CM  Brain mass 348.9 G93.9   HISTORY OF PRESENT ILLNESS::Anthony Lopez is a 68 y.o. male who is presented with a 3  week history of intermittent low grade hemoptysis, headache, mild intermittent confusion, and ataxia. He has had difficulty with gait. He has been walking into objects and bumping into objects. Evaluation started as an outpatient by his primary care physician included a chest radiograph which was abnormal with a suspicious approximate nodule in the left lung.     Further evaluation was planned but the patient's symptoms progressed and he was brought to the emergency department. Noncontrast CT scan of the brain shows 2 large areas of extensive cerebral edema surrounding likely cerebral metastases in the right frontal and right occipital lobes.     CT scan of the chest shows a 4.3 x 3.7 x 4.3 cm thick-walled cavitary mass in the superior segment of the left lower lobe with associated left hilar lymphadenopathy. There is likely an associated postobstructive pneumonitis.     Brain MRI is pending.  At present the findings are suggestive of primary lung cancer with brain metastases.  Biopsy site could be brain via craniotomy versus CT-guided lung biopsy, depending on brain MRI results.  PREVIOUS RADIATION THERAPY: No  PAST MEDICAL HISTORY:  has a past medical history of CVA (cerebral infarction) (2000); HTN (hypertension), benign; Rheumatic fever; and Tobacco abuse.    PAST SURGICAL HISTORY: Past Surgical History  Procedure Laterality Date  . Transthoracic echocardiogram  January  2004    Normal LV size and function. EF 55-65%.  . Carotid dopplers Bilateral 07/18/2013    Right subclavian 0-49%, left subclavian mild bilateral carotid stenosis..  . 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   . Mohs surgery      h/o NMSC (scc or bcc)    FAMILY HISTORY: family history includes Diabetes in his father and mother; Heart attack in his father; Hypertension in his daughter; Skin cancer in his mother.  SOCIAL HISTORY:  reports that he has been smoking Cigarettes.  He has a 50 pack-year smoking history. He does not have any smokeless tobacco history on file. He reports that he does not drink alcohol.  ALLERGIES: Doxycycline and Penicillins  MEDICATIONS:  Current Facility-Administered Medications  Medication Dose Route Frequency Provider Last Rate Last Dose  . 0.9 %  sodium chloride infusion   Intravenous PRN Annia Belt, MD 20 mL/hr at 07/04/14 1200    . acetaminophen (TYLENOL) tablet 650 mg  650 mg Oral Q6H PRN Cresenciano Genre, MD       Or  . acetaminophen (TYLENOL) suppository 650 mg  650 mg Rectal Q6H PRN Cresenciano Genre, MD      . dexamethasone (DECADRON) injection 4 mg  4 mg Intravenous 4 times per day Cresenciano Genre, MD   4 mg at 07/04/14 1203  . enoxaparin (LOVENOX) injection 40 mg  40 mg Subcutaneous Q24H Cresenciano Genre, MD   40 mg at 07/04/14 0946  . lisinopril (PRINIVIL,ZESTRIL) tablet 10 mg  10 mg Oral Daily Cresenciano Genre, MD   10 mg at 07/04/14 0947  . [START ON 07/05/2014] nicotine (NICODERM CQ - dosed in mg/24 hours) patch 21 mg  21 mg Transdermal Q24H Annia Belt, MD      . ondansetron [facility]) injection 4 mg  4 mg Intravenous Q6H PRN Cresenciano Genre, MD      . sodium chloride 0.9 % injection 3 mL  3 mL Intravenous Q12H Cresenciano Genre, MD   3 mL at 07/04/14 1201    REVIEW OF SYSTEMS:  A 15 point review of systems is documented in the electronic  medical record. This was obtained by the nursing staff. However, I reviewed this with the patient to discuss relevant findings and make appropriate changes.  Pertinent items are noted in HPI.   PHYSICAL EXAM:  height is 5\' 10"  (1.778 m) and weight is 133 lb 13.1 oz (60.7 kg). His oral temperature is 97.8 F (36.6 C). His blood pressure is 128/65 and his pulse is 66. His respiration is 19 and oxygen saturation is 98%.   per internal medicine: General: resting in bed, NAD HEENT: left pupil larger than right. Reactive, EOMI, no scleral icterus Cardiac: RRR, no rubs, murmurs or gallops Pulm: clear to auscultation bilaterally, no wheezes, rales, or rhonchi Abd: soft, nontender, nondistended, BS present Ext: warm and well perfused, no pedal edema Neuro: alert and oriented X3, cranial nerves II-XII grossly intact, strength and sensation to light touch equal in bilateral upper and lower extremities. Some word finding difficulty but good calculation function.  KPS = 80  100 - Normal; no complaints; no evidence of disease. 90   - Able to carry on normal activity; minor signs or symptoms of disease. 80   - Normal activity with effort; some signs or symptoms of disease. 35   - Cares for self; unable to carry on normal activity or to do active work. 60   - Requires occasional assistance, but is able to care for most of his personal needs. 50   - Requires considerable assistance and frequent medical care. 71   - Disabled; requires special care and assistance. 77   - Severely disabled; hospital admission is indicated although death not imminent. 43   - Very sick; hospital admission necessary; active supportive treatment necessary. 10   - Moribund; fatal processes progressing rapidly. 0     - Dead  Karnofsky DA, Abelmann [place], Craver LS and [place] [facility] [phone]) The use of the nitrogen mustards in the palliative treatment of carcinoma: with particular reference to bronchogenic carcinoma Cancer 1  634-56  LABORATORY DATA:  Lab Results  Component Value Date   WBC 7.5 07/04/2014   HGB 11.4* 07/04/2014   HCT 33.5* 07/04/2014   MCV 89.3 07/04/2014   PLT 150 07/04/2014   Lab Results  Component Value Date   NA 133* 07/04/2014   K 4.2 07/04/2014   CL 101 07/04/2014   CO2 23 07/04/2014   Lab Results  Component Value Date   ALT 9 07/03/2014   AST 11 07/03/2014   ALKPHOS 57 07/03/2014   BILITOT 0.2* 07/03/2014     RADIOGRAPHY: Dg Chest 2 View  07/03/2014   CLINICAL DATA:  Chest pain, brain metastasis on CT same day  EXAM: CHEST  2 VIEW  COMPARISON:  Chest radiograph 06/30/2014  FINDINGS: Normal cardiac silhouette. The left mid lung lesion measures 14 mm decreased from 22 mm on prior. There is airspace disease in lower lobe  seen on lateral projection. No aggressive osseous lesion.  IMPRESSION: 1. The left lung lesion is smaller than comparison exam but still concerning for lung cancer given the brain lesions. Recommend CT thorax with contrast for further evaluation.  2.  Potential left lower lobe pneumonia.   Electronically Signed   By: Suzy Bouchard M.D.   On: 07/03/2014 18:20   Ct Head Wo Contrast  07/03/2014   ADDENDUM REPORT: 07/03/2014 18:25  ADDENDUM: A chest x-ray was performed following the head CT. The left lung lesion is decreased in size over 3 day interval. This raises the concern that the pulmonary lesions could be cerebral abscesses. Metastatic disease remains in differential. Recommend MRI of the brain with and without contrast to distinguish between infection and neoplasm.  Additional findings and recommendations conveyed toJASON MESNER on 07/03/2014 at18:24.   Electronically Signed   By: Suzy Bouchard M.D.   On: 07/03/2014 18:25   07/03/2014   CLINICAL DATA:  Gait abnormality  EXAM: CT HEAD WITHOUT CONTRAST  TECHNIQUE: Contiguous axial images were obtained from the base of the skull through the vertex without intravenous contrast.  COMPARISON:  Head CT 10/19/2005  on chest radiograph 07/30/2014  FINDINGS: There is round lesion in right frontal lobe measuring 30 mm which has centrally low-attenuation. There is a large field of a the vasogenic edema surrounding this intraparenchymal lesion. There is a subfalcine herniation in the right frontal lobe with 7 cm of leftward midline shift. There is a second lesion in the right occipital lobe measuring 2.3 cm also with a large field of vasogenic edema.  There is compression of the lateral ventricles on the right. The quadrigeminal plate cistern is effaced on the left. There is 1e mm of midline shift centrally at the level of the ventricles. No intraparenchymal hemorrhage.  IMPRESSION: 1. Metastatic lesions in the right frontal lobe and right occipital lobe with extensive vasogenic edema. 2. Moderate leftward midline shift. 3. Effacement of the quadrigeminal plate cistern on the left. Subfalcine herniation anteriorly. 4. Patient has a lesion in the left lung on chest x-ray therefore this likely represents metastatic lung cancer. Critical Value/emergent results were called by telephone at the time of interpretation on 07/03/2014 at 6:13 pm to Dr. Merrily Pew , who verbally acknowledged these results.  Electronically Signed: By: Suzy Bouchard M.D. On: 07/03/2014 18:14   Ct Chest W Contrast  07/03/2014   CLINICAL DATA:  68 year old male with lung lesion and brain mass. Evaluate for underlying malignancy.  EXAM: CT CHEST, ABDOMEN, AND PELVIS WITH CONTRAST  TECHNIQUE: Multidetector CT imaging of the chest, abdomen and pelvis was performed following the standard protocol during bolus administration of intravenous contrast.  CONTRAST:  111mL OMNIPAQUE IOHEXOL 300 MG/ML  SOLN  COMPARISON:  No priors.  FINDINGS: CT CHEST FINDINGS  Mediastinum: Heart size is normal. There is no significant pericardial fluid, thickening or pericardial calcification. There is atherosclerosis of the thoracic aorta, the great vessels of the mediastinum and  the coronary arteries, including calcified atherosclerotic plaque in the left main, left anterior descending and right coronary arteries. Left hilar lymphadenopathy measuring up to 19 x 27 mm (image 26 of series 201). No other mediastinal or right hilar adenopathy is noted. Esophagus is unremarkable in appearance. Separate origin of the left vertebral artery directly off the aortic arch (normal anatomical variant) incidentally noted.  Lungs/Pleura: Centered in the superior segment of the left lower lobe is a 4.3 x 3.7 x 4.0 cm thick-walled cavitary mass, highly suspicious  for a cavitary neoplasm. Adjacent to this within the medial aspect of the left lower lobe there is extensive airspace consolidation and septal thickening. Focal pleuroparenchymal architectural distortion in the apex of the right upper lobe is strongly favored to be chronic post infectious or inflammatory scarring. No other suspicious appearing pulmonary nodules or masses are noted at this time. There is a background of moderate centrilobular emphysema with mild diffuse bronchial wall thickening. No pleural effusions. Small amount of scarring in the inferior segment of the lingula.  Musculoskeletal: There are no aggressive appearing lytic or blastic lesions noted in the visualized portions of the skeleton.  CT ABDOMEN AND PELVIS FINDINGS  Hepatobiliary: No focal cystic or solid hepatic lesions. No intra or extrahepatic biliary ductal dilatation. Gallbladder is normal in appearance.  Pancreas: Unremarkable.  Spleen: Unremarkable.  Adrenals/Urinary Tract: Extensive atrophy in the lower pole of the right kidney, presumably from prior infection/ inflammation. Sub cm low-attenuation lesions in the kidneys bilaterally too small to characterize, but favored to represent small cysts. In addition, there is a 1.6 cm simple cyst in the interpolar region of the left kidney. No hydroureteronephrosis. Urinary bladder is normal in appearance.  Stomach/Bowel:  Normal appearance of the stomach. No pathologic dilatation of small bowel or colon. Normal appendix (retrocecal).  Vascular/Lymphatic: Moderate to severe stenoses at the origins of the celiac axis and superior mesenteric arteries. Complete occlusion of the abdominal aorta immediately beneath the origin of the renal arteries. This occlusion extends into the pelvis where the common iliac arteries, internal iliac arteries and external iliac arteries are all completely occluded bilaterally. There is reconstitution of flow in the common femoral arteries bilaterally, and there are multiple collateral arteries noted in the pelvic sidewall bilaterally. IMA origin is also chronically occluded, although the distal aspect of the vessel is patent secondary to a collateral pathway which appears to involve a branch from the proximal superior mesenteric artery. No lymphadenopathy noted in the abdomen or pelvis.  Reproductive: Prostate gland and seminal vesicles are unremarkable in appearance.  Other: No significant volume of ascites.  No pneumoperitoneum.  Musculoskeletal: There are no aggressive appearing lytic or blastic lesions noted in the visualized portions of the skeleton.  IMPRESSION: 1. 4.3 x 3.7 x 4.3 cm thick-walled cavitary mass in the superior segment of the left lower lobe with associated left hilar adenopathy, and previously diagnosed brain metastases. Findings are compatible with stage IV lung cancer (T2A, N1, M1b). 2. Extensive airspace consolidation in the medial aspect of the left lower lobe. Although this could in part related to postobstructive changes, the position of lesion argues against this. This may simply represent endobronchial spread of hemorrhage or secretions from the lesion, as lesion does appear to communicate with the airways. Alternatively, this could represent some lymphangitic spread of tumor, however, that is not strongly favored at this time. 3. Extensive atherosclerosis, including complete  occlusion of the infrarenal abdominal aorta, bilateral common iliac arteries, bilateral internal iliac arteries and bilateral external iliac arteries, with moderate to severe stenosis at the ostia of the celiac axis and superior mesenteric arteries, as well as complete occlusion at the ostium of the inferior mesenteric artery (with collateral flow to the distal circulation of the IMA from the proximal superior mesenteric artery). In addition, there is left main and 2 vessel coronary artery disease. 4. Additional incidental findings, as above.   Electronically Signed   By: Vinnie Langton M.D.   On: 07/03/2014 22:00   Ct Abdomen Pelvis W Contrast  07/03/2014   CLINICAL DATA:  68 year old male with lung lesion and brain mass. Evaluate for underlying malignancy.  EXAM: CT CHEST, ABDOMEN, AND PELVIS WITH CONTRAST  TECHNIQUE: Multidetector CT imaging of the chest, abdomen and pelvis was performed following the standard protocol during bolus administration of intravenous contrast.  CONTRAST:  173mL OMNIPAQUE IOHEXOL 300 MG/ML  SOLN  COMPARISON:  No priors.  FINDINGS: CT CHEST FINDINGS  Mediastinum: Heart size is normal. There is no significant pericardial fluid, thickening or pericardial calcification. There is atherosclerosis of the thoracic aorta, the great vessels of the mediastinum and the coronary arteries, including calcified atherosclerotic plaque in the left main, left anterior descending and right coronary arteries. Left hilar lymphadenopathy measuring up to 19 x 27 mm (image 26 of series 201). No other mediastinal or right hilar adenopathy is noted. Esophagus is unremarkable in appearance. Separate origin of the left vertebral artery directly off the aortic arch (normal anatomical variant) incidentally noted.  Lungs/Pleura: Centered in the superior segment of the left lower lobe is a 4.3 x 3.7 x 4.0 cm thick-walled cavitary mass, highly suspicious for a cavitary neoplasm. Adjacent to this within the medial  aspect of the left lower lobe there is extensive airspace consolidation and septal thickening. Focal pleuroparenchymal architectural distortion in the apex of the right upper lobe is strongly favored to be chronic post infectious or inflammatory scarring. No other suspicious appearing pulmonary nodules or masses are noted at this time. There is a background of moderate centrilobular emphysema with mild diffuse bronchial wall thickening. No pleural effusions. Small amount of scarring in the inferior segment of the lingula.  Musculoskeletal: There are no aggressive appearing lytic or blastic lesions noted in the visualized portions of the skeleton.  CT ABDOMEN AND PELVIS FINDINGS  Hepatobiliary: No focal cystic or solid hepatic lesions. No intra or extrahepatic biliary ductal dilatation. Gallbladder is normal in appearance.  Pancreas: Unremarkable.  Spleen: Unremarkable.  Adrenals/Urinary Tract: Extensive atrophy in the lower pole of the right kidney, presumably from prior infection/ inflammation. Sub cm low-attenuation lesions in the kidneys bilaterally too small to characterize, but favored to represent small cysts. In addition, there is a 1.6 cm simple cyst in the interpolar region of the left kidney. No hydroureteronephrosis. Urinary bladder is normal in appearance.  Stomach/Bowel: Normal appearance of the stomach. No pathologic dilatation of small bowel or colon. Normal appendix (retrocecal).  Vascular/Lymphatic: Moderate to severe stenoses at the origins of the celiac axis and superior mesenteric arteries. Complete occlusion of the abdominal aorta immediately beneath the origin of the renal arteries. This occlusion extends into the pelvis where the common iliac arteries, internal iliac arteries and external iliac arteries are all completely occluded bilaterally. There is reconstitution of flow in the common femoral arteries bilaterally, and there are multiple collateral arteries noted in the pelvic sidewall  bilaterally. IMA origin is also chronically occluded, although the distal aspect of the vessel is patent secondary to a collateral pathway which appears to involve a branch from the proximal superior mesenteric artery. No lymphadenopathy noted in the abdomen or pelvis.  Reproductive: Prostate gland and seminal vesicles are unremarkable in appearance.  Other: No significant volume of ascites.  No pneumoperitoneum.  Musculoskeletal: There are no aggressive appearing lytic or blastic lesions noted in the visualized portions of the skeleton.  IMPRESSION: 1. 4.3 x 3.7 x 4.3 cm thick-walled cavitary mass in the superior segment of the left lower lobe with associated left hilar adenopathy, and previously diagnosed brain metastases. Findings are  compatible with stage IV lung cancer (T2A, N1, M1b). 2. Extensive airspace consolidation in the medial aspect of the left lower lobe. Although this could in part related to postobstructive changes, the position of lesion argues against this. This may simply represent endobronchial spread of hemorrhage or secretions from the lesion, as lesion does appear to communicate with the airways. Alternatively, this could represent some lymphangitic spread of tumor, however, that is not strongly favored at this time. 3. 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 In addition, there is left main and 2 vessel coronary artery disease. 4. Additional incidental findings, as above.   Electronically Signed   By: Vinnie Langton M.D.   On: 07/03/2014 22:00        IMPRESSION: This patient is a very nice 68 yo gentleman with multiple probable brain metastases from a newly discovered left lower lung cavitary mass consistent with metastatic lung cancer.  But, tissue diagnosis has not been established.  PLAN: Agree with continuation of dexamethasone. Agree with MRI to better delineate the location and extent of brain lesions.  I would recommend biopsy.  Once the brain MRI is completed and biopsy is done, the patient could be either discharged to home if stable, or transferred to [facility] for likely brain irradiation.  Will follow up on results of the MRI.   I spent 30 minutes minutes face to face with the patient and more than 50% of that time was spent in counseling and/or coordination of care.   ------------------------------------------------  Sheral Apley. Tammi Klippel, M.D.

## 2014-07-04 NOTE — Progress Notes (Signed)
Subjective:  Has hemoptysis x2 weeks (some streaks of blood mixed with sputum), with right frontal Headache 1 week, and feeling off balance (ataxia, dizzy and vertigo) for few weeks. Also having confusion and word finding problems recently.  Headache is improving and dizziness is improved somewhat. CXR showed left lung lesion, CT head showing metastatic lesion in right frontal lobe and right occipital lobe with vasogenic edema, with leftward midline shift. CT abdomen/pelvis showed thick-walled cavitary mass in LLL.   Objective: Vital signs in last 24 hours: Filed Vitals:   07/04/14 0700 07/04/14 0730 07/04/14 0800 07/04/14 0947  BP: 120/51 97/78 126/57 134/59  Pulse:  64    Temp:  97.5 F (36.4 C)    TempSrc:  Oral    Resp:  13    Height:      Weight:      SpO2:  100%     Weight change:  No intake or output data in the 24 hours ending 07/04/14 1146 Vitals reviewed. General: resting in bed, NAD HEENT: left pupil larger than right. Reactive, EOMI, no scleral icterus Cardiac: RRR, no rubs, murmurs or gallops Pulm: clear to auscultation bilaterally, no wheezes, rales, or rhonchi Abd: soft, nontender, nondistended, BS present Ext: warm and well perfused, no pedal edema Neuro: alert and oriented X3, cranial nerves II-XII grossly intact, strength and sensation to light touch equal in bilateral upper and lower extremities. Some word finding difficulty but good calculation function.  Lab Results: Basic Metabolic Panel:  Recent Labs Lab 07/03/14 1815 07/04/14 0250  NA 139 133*  K 4.3 4.2  CL 104 101  CO2 26 23  GLUCOSE 81 194*  BUN 30* 27*  CREATININE 0.98 0.86  CALCIUM 9.1 8.9  MG  --  1.9   Liver Function Tests:  Recent Labs Lab 07/03/14 1815  AST 11  ALT 9  ALKPHOS 57  BILITOT 0.2*  PROT 6.4  ALBUMIN 3.4*   No results for input(s): LIPASE, AMYLASE in the last 168 hours. No results for input(s): AMMONIA in the last 168 hours. CBC:  Recent Labs Lab  07/03/14 1815 07/04/14 0250  WBC 7.8 7.5  NEUTROABS 3.7 5.8  HGB 11.7* 11.4*  HCT 34.7* 33.5*  MCV 91.1 89.3  PLT 151 150   Cardiac Enzymes: No results for input(s): CKTOTAL, CKMB, CKMBINDEX, TROPONINI in the last 168 hours. BNP: No results for input(s): PROBNP in the last 168 hours. D-Dimer: No results for input(s): DDIMER in the last 168 hours. CBG: No results for input(s): GLUCAP in the last 168 hours. Hemoglobin A1C: No results for input(s): HGBA1C in the last 168 hours. Fasting Lipid Panel: No results for input(s): CHOL, HDL, LDLCALC, TRIG, CHOLHDL, LDLDIRECT in the last 168 hours. Thyroid Function Tests: No results for input(s): TSH, T4TOTAL, FREET4, T3FREE, THYROIDAB in the last 168 hours. Coagulation: No results for input(s): LABPROT, INR in the last 168 hours. Anemia Panel: No results for input(s): VITAMINB12, FOLATE, FERRITIN, TIBC, IRON, RETICCTPCT in the last 168 hours. Urine Drug Screen: Drugs of Abuse  No results found for: LABOPIA, COCAINSCRNUR, LABBENZ, AMPHETMU, THCU, LABBARB  Alcohol Level: No results for input(s): ETH in the last 168 hours. Urinalysis:  Recent Labs Lab 07/03/14 2038  COLORURINE YELLOW  LABSPEC 1.026  PHURINE 6.5  GLUCOSEU NEGATIVE  HGBUR TRACE*  BILIRUBINUR NEGATIVE  KETONESUR NEGATIVE  PROTEINUR NEGATIVE  UROBILINOGEN 1.0  NITRITE NEGATIVE  LEUKOCYTESUR NEGATIVE   Misc. Labs:  Micro Results: Recent Results (from the past 240 hour(s))  MRSA  PCR Screening     Status: None   Collection Time: 07/03/14  9:52 PM  Result Value Ref Range Status   MRSA by PCR NEGATIVE NEGATIVE Final    Comment:        The GeneXpert MRSA Assay (FDA approved for NASAL specimens only), is one component of a comprehensive MRSA colonization surveillance program. It is not intended to diagnose MRSA infection nor to guide or monitor treatment for MRSA infections.    Studies/Results: Dg Chest 2 View  07/03/2014   CLINICAL DATA:  Chest pain,  brain metastasis on CT same day  EXAM: CHEST  2 VIEW  COMPARISON:  Chest radiograph 06/30/2014  FINDINGS: Normal cardiac silhouette. The left mid lung lesion measures 14 mm decreased from 22 mm on prior. There is airspace disease in lower lobe seen on lateral projection. No aggressive osseous lesion.  IMPRESSION: 1. The left lung lesion is smaller than comparison exam but still concerning for lung cancer given the brain lesions. Recommend CT thorax with contrast for further evaluation.  2.  Potential left lower lobe pneumonia.   Electronically Signed   By: Suzy Bouchard M.D.   On: 07/03/2014 18:20   Ct Head Wo Contrast  07/03/2014   ADDENDUM REPORT: 07/03/2014 18:25  ADDENDUM: A chest x-ray was performed following the head CT. The left lung lesion is decreased in size over 3 day interval. This raises the concern that the pulmonary lesions could be cerebral abscesses. Metastatic disease remains in differential. Recommend MRI of the brain with and without contrast to distinguish between infection and neoplasm.  Additional findings and recommendations conveyed toJASON MESNER on 07/03/2014 at18:24.   Electronically Signed   By: Suzy Bouchard M.D.   On: 07/03/2014 18:25   07/03/2014   CLINICAL DATA:  Gait abnormality  EXAM: CT HEAD WITHOUT CONTRAST  TECHNIQUE: Contiguous axial images were obtained from the base of the skull through the vertex without intravenous contrast.  COMPARISON:  Head CT 10/19/2005 on chest radiograph 07/30/2014  FINDINGS: There is round lesion in right frontal lobe measuring 30 mm which has centrally low-attenuation. There is a large field of a the vasogenic edema surrounding this intraparenchymal lesion. There is a subfalcine herniation in the right frontal lobe with 7 cm of leftward midline shift. There is a second lesion in the right occipital lobe measuring 2.3 cm also with a large field of vasogenic edema.  There is compression of the lateral ventricles on the right. The  quadrigeminal plate cistern is effaced on the left. There is 1e mm of midline shift centrally at the level of the ventricles. No intraparenchymal hemorrhage.  IMPRESSION: 1. Metastatic lesions in the right frontal lobe and right occipital lobe with extensive vasogenic edema. 2. Moderate leftward midline shift. 3. Effacement of the quadrigeminal plate cistern on the left. Subfalcine herniation anteriorly. 4. Patient has a lesion in the left lung on chest x-ray therefore this likely represents metastatic lung cancer. Critical Value/emergent results were called by telephone at the time of interpretation on 07/03/2014 at 6:13 pm to Dr. Merrily Pew , who verbally acknowledged these results.  Electronically Signed: By: Suzy Bouchard M.D. On: 07/03/2014 18:14   Ct Chest W Contrast  07/03/2014   CLINICAL DATA:  68 year old male with lung lesion and brain mass. Evaluate for underlying malignancy.  EXAM: CT CHEST, ABDOMEN, AND PELVIS WITH CONTRAST  TECHNIQUE: Multidetector CT imaging of the chest, abdomen and pelvis was performed following the standard protocol during bolus administration of intravenous contrast.  CONTRAST:  144mL OMNIPAQUE IOHEXOL 300 MG/ML  SOLN  COMPARISON:  No priors.  FINDINGS: CT CHEST FINDINGS  Mediastinum: Heart size is normal. There is no significant pericardial fluid, thickening or pericardial calcification. There is atherosclerosis of the thoracic aorta, the great vessels of the mediastinum and the coronary arteries, including calcified atherosclerotic plaque in the left main, left anterior descending and right coronary arteries. Left hilar lymphadenopathy measuring up to 19 x 27 mm (image 26 of series 201). No other mediastinal or right hilar adenopathy is noted. Esophagus is unremarkable in appearance. Separate origin of the left vertebral artery directly off the aortic arch (normal anatomical variant) incidentally noted.  Lungs/Pleura: Centered in the superior segment of the left lower  lobe is a 4.3 x 3.7 x 4.0 cm thick-walled cavitary mass, highly suspicious for a cavitary neoplasm. Adjacent to this within the medial aspect of the left lower lobe there is extensive airspace consolidation and septal thickening. Focal pleuroparenchymal architectural distortion in the apex of the right upper lobe is strongly favored to be chronic post infectious or inflammatory scarring. No other suspicious appearing pulmonary nodules or masses are noted at this time. There is a background of moderate centrilobular emphysema with mild diffuse bronchial wall thickening. No pleural effusions. Small amount of scarring in the inferior segment of the lingula.  Musculoskeletal: There are no aggressive appearing lytic or blastic lesions noted in the visualized portions of the skeleton.  CT ABDOMEN AND PELVIS FINDINGS  Hepatobiliary: No focal cystic or solid hepatic lesions. No intra or extrahepatic biliary ductal dilatation. Gallbladder is normal in appearance.  Pancreas: Unremarkable.  Spleen: Unremarkable.  Adrenals/Urinary Tract: Extensive atrophy in the lower pole of the right kidney, presumably from prior infection/ inflammation. Sub cm low-attenuation lesions in the kidneys bilaterally too small to characterize, but favored to represent small cysts. In addition, there is a 1.6 cm simple cyst in the interpolar region of the left kidney. No hydroureteronephrosis. Urinary bladder is normal in appearance.  Stomach/Bowel: Normal appearance of the stomach. No pathologic dilatation of small bowel or colon. Normal appendix (retrocecal).  Vascular/Lymphatic: Moderate to severe stenoses at the origins of the celiac axis and superior mesenteric arteries. Complete occlusion of the abdominal aorta immediately beneath the origin of the renal arteries. This occlusion extends into the pelvis where the common iliac arteries, internal iliac arteries and external iliac arteries are all completely occluded bilaterally. There is  reconstitution of flow in the common femoral arteries bilaterally, and there are multiple collateral arteries noted in the pelvic sidewall bilaterally. IMA origin is also chronically occluded, although the distal aspect of the vessel is patent secondary to a collateral pathway which appears to involve a branch from the proximal superior mesenteric artery. No lymphadenopathy noted in the abdomen or pelvis.  Reproductive: Prostate gland and seminal vesicles are unremarkable in appearance.  Other: No significant volume of ascites.  No pneumoperitoneum.  Musculoskeletal: There are no aggressive appearing lytic or blastic lesions noted in the visualized portions of the skeleton.  IMPRESSION: 1. 4.3 x 3.7 x 4.3 cm thick-walled cavitary mass in the superior segment of the left lower lobe with associated left hilar adenopathy, and previously diagnosed brain metastases. Findings are compatible with stage IV lung cancer (T2A, N1, M1b). 2. Extensive airspace consolidation in the medial aspect of the left lower lobe. Although this could in part related to postobstructive changes, the position of lesion argues against this. This may simply represent endobronchial spread of hemorrhage or secretions from the lesion,  as lesion does appear to communicate with the airways. Alternatively, this could represent some lymphangitic spread of tumor, however, that is not strongly favored at this time. 3. 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 In addition, there is left main and 2 vessel coronary artery  disease. 4. Additional incidental findings, as above.   Electronically Signed   By: Vinnie Langton M.D.   On: 07/03/2014 22:00   Ct Abdomen Pelvis W Contrast  07/03/2014   CLINICAL DATA:  68 year old male with lung lesion and brain mass. Evaluate for underlying malignancy.  EXAM: CT CHEST, ABDOMEN, AND PELVIS WITH CONTRAST  TECHNIQUE: Multidetector CT imaging of the chest, abdomen and pelvis was performed following the standard protocol during bolus administration of intravenous contrast.  CONTRAST:  125mL OMNIPAQUE IOHEXOL 300 MG/ML  SOLN  COMPARISON:  No priors.  FINDINGS: CT CHEST FINDINGS  Mediastinum: Heart size is normal. There is no significant pericardial fluid, thickening or pericardial calcification. There is atherosclerosis of the thoracic aorta, the great vessels of the mediastinum and the coronary arteries, including calcified atherosclerotic plaque in the left main, left anterior descending and right coronary arteries. Left hilar lymphadenopathy measuring up to 19 x 27 mm (image 26 of series 201). No other mediastinal or right hilar adenopathy is noted. Esophagus is unremarkable in appearance. Separate origin of the left vertebral artery directly off the aortic arch (normal anatomical variant) incidentally noted.  Lungs/Pleura: Centered in the superior segment of the left lower lobe is a 4.3 x 3.7 x 4.0 cm thick-walled cavitary mass, highly suspicious for a cavitary neoplasm. Adjacent to this within the medial aspect of the left lower lobe there is extensive airspace consolidation and septal thickening. Focal pleuroparenchymal architectural distortion in the apex of the right upper lobe is strongly favored to be chronic post infectious or inflammatory scarring. No other suspicious appearing pulmonary nodules or masses are noted at this time. There is a background of moderate centrilobular emphysema with mild diffuse bronchial wall thickening. No pleural effusions. Small amount of scarring in the  inferior segment of the lingula.  Musculoskeletal: There are no aggressive appearing lytic or blastic lesions noted in the visualized portions of the skeleton.  CT ABDOMEN AND PELVIS FINDINGS  Hepatobiliary: No focal cystic or solid hepatic lesions. No intra or extrahepatic biliary ductal dilatation. Gallbladder is normal in appearance.  Pancreas: Unremarkable.  Spleen: Unremarkable.  Adrenals/Urinary Tract: Extensive atrophy in the lower pole of the right kidney, presumably from prior infection/ inflammation. Sub cm low-attenuation lesions in the kidneys bilaterally too small to characterize, but favored to represent small cysts. In addition, there is a 1.6 cm simple cyst in the interpolar region of the left kidney. No hydroureteronephrosis. Urinary bladder is normal in appearance.  Stomach/Bowel: Normal appearance of the stomach. No pathologic dilatation of small bowel or colon. Normal appendix (retrocecal).  Vascular/Lymphatic: Moderate to severe stenoses at the origins of the celiac axis and superior mesenteric arteries. Complete occlusion of the abdominal aorta immediately beneath the origin of the renal arteries. This occlusion extends into the pelvis where the common iliac arteries, internal iliac arteries and external iliac arteries are all completely  occluded bilaterally. There is reconstitution of flow in the common femoral arteries bilaterally, and there are multiple collateral arteries noted in the pelvic sidewall bilaterally. IMA origin is also chronically occluded, although the distal aspect of the vessel is patent secondary to a collateral pathway which appears to involve a branch from the proximal superior mesenteric artery. No lymphadenopathy noted in the abdomen or pelvis.  Reproductive: Prostate gland and seminal vesicles are unremarkable in appearance.  Other: No significant volume of ascites.  No pneumoperitoneum.  Musculoskeletal: There are no aggressive appearing lytic or blastic lesions noted  in the visualized portions of the skeleton.  IMPRESSION: 1. 4.3 x 3.7 x 4.3 cm thick-walled cavitary mass in the superior segment of the left lower lobe with associated left hilar adenopathy, and previously diagnosed brain metastases. Findings are compatible with stage IV lung cancer (T2A, N1, M1b). 2. Extensive airspace consolidation in the medial aspect of the left lower lobe. Although this could in part related to postobstructive changes, the position of lesion argues against this. This may simply represent endobronchial spread of hemorrhage or secretions from the lesion, as lesion does appear to communicate with the airways. Alternatively, this could represent some lymphangitic spread of tumor, however, that is not strongly favored at this time. 3. 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 In addition, there is left main and 2 vessel coronary artery disease. 4. Additional incidental findings, as above.   Electronically Signed   By: Vinnie Langton M.D.   On: 07/03/2014 22:00   Medications: I have reviewed the patient's current medications. Scheduled Meds: . dexamethasone  4 mg Intravenous 4 times per day  . enoxaparin (LOVENOX) injection  40 mg Subcutaneous Q24H  . lisinopril  10 mg Oral Daily  . [START ON 07/05/2014] nicotine  21 mg Transdermal Q24H  . sodium chloride  3 mL Intravenous Q12H   Continuous Infusions:  PRN Meds:.acetaminophen **OR** acetaminophen, ondansetron (ZOFRAN) IV Assessment/Plan: Principal Problem:   Lung cancer Active Problems:   Tobacco  abuse   HTN (hypertension)   Brain edema   Brain metastasis   Abnormality of gait   Headache  68 yo male with hx of basal cell carcinoma, 125pack year smoking hx here with hemoptysis found to have cavitary lung lesion and likely mets to the brain.  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 - neurosurgery and radiation onc consulted and on board. Follow their recs - continue decadron 4mg  IV (headache improving with it). - MRI pending to better characterize the mass. Neurosurgery will evaluate after MRI if he will be a surgical resection candidate and whether brain mass is accessible. If not, may try to get source from CT guided perc lung mass biopsy. - continue zofran for nausea  HTN - normotensive (under goal <150). - Continue lisinopril 10mg  daily  Dispo: Disposition is deferred at this time, awaiting improvement of current medical problems.  Anticipated discharge in approximately 1-2 day(s).   The  patient does have a current PCP Aletha Halim, PA-C) and does need an [facility] hospital follow-up appointment after discharge.  The patient does have transportation limitations that hinder transportation to clinic appointments.  .Services Needed at time of discharge: Y = Yes, Blank = No PT:   OT:   RN:   Equipment:   Other:     LOS: 1 day   Dellia Nims, MD 07/04/2014, 11:46 AM

## 2014-07-05 DIAGNOSIS — R911 Solitary pulmonary nodule: Secondary | ICD-10-CM | POA: Insufficient documentation

## 2014-07-05 LAB — BASIC METABOLIC PANEL
Anion gap: 11 (ref 5–15)
BUN: 24 mg/dL — AB (ref 6–23)
CALCIUM: 9.2 mg/dL (ref 8.4–10.5)
CO2: 25 meq/L (ref 19–32)
CREATININE: 0.81 mg/dL (ref 0.50–1.35)
Chloride: 101 mEq/L (ref 96–112)
GFR calc Af Amer: >90 mL/min (ref >90)
Glucose, Bld: 209 mg/dL — ABNORMAL HIGH (ref 70–99)
Potassium: 4.4 mEq/L (ref 3.7–5.3)
Sodium: 137 mEq/L (ref 137–147)

## 2014-07-05 LAB — CBC
HEMATOCRIT: 33.1 % — AB (ref 39.0–52.0)
Hemoglobin: 11.3 g/dL — ABNORMAL LOW (ref 13.0–17.0)
MCH: 30.2 pg (ref 26.0–34.0)
MCHC: 34.1 g/dL (ref 30.0–36.0)
MCV: 88.5 fL (ref 78.0–100.0)
PLATELETS: 183 10*3/uL (ref 150–400)
RBC: 3.74 MIL/uL — ABNORMAL LOW (ref 4.22–5.81)
RDW: 12.6 % (ref 11.5–15.5)
WBC: 14 10*3/uL — AB (ref 4.0–10.5)

## 2014-07-05 MED ORDER — OXYCODONE HCL 5 MG PO TABS
5.0000 mg | ORAL_TABLET | Freq: Four times a day (QID) | ORAL | Status: DC | PRN
  Administered 2014-07-05: 5 mg via ORAL
  Filled 2014-07-05: qty 1

## 2014-07-05 NOTE — Progress Notes (Signed)
Patient ID: Anthony Lopez, male   DOB: 1946/01/19, 68 y.o.   MRN: [id number] Medicine attending: I personally interviewed and examined this patient today and discussed status with multiple family members. MRI shows that the brain lesion seen on CT scan are cystic in nature. In addition to the large 3 x 4 cm right frontal lobe and 2.5 x 3 cm parietal lobe lesions, an additional small 73mm right occipital lobe lesion is also identified. Although radiologist includes possibility of brain abscess in his differential, this patient has a heavy cigarette smoking history, a lung lesion, associated hilar adenopathy, and no history of a concomitant immunodeficiency disease such as HIV making most likely diagnosis still cystic metastases from a primary lung cancer. We reviewed his CT chest with the interventional radiologist. The lung lesion is accessible to needle biopsy. We will wait for the final disposition from neurosurgery. If the brain lesions are resectable, then we will not have to put the patient through 2 separate biopsy procedures. If they are not deemed resectable, we will proceed with a transcutaneous biopsy of the lung lesion. In any event, he will need cranial radiation and referral to thoracic oncology subsequent to that.

## 2014-07-05 NOTE — Plan of Care (Signed)
Problem: Consults Goal: General Medical Patient Education See Patient Education Module for specific education. Outcome: Progressing  Problem: Phase I Progression Outcomes Goal: Pain controlled with appropriate interventions Outcome: Progressing Goal: OOB as tolerated unless otherwise ordered Outcome: Not Progressing Goal: Voiding-avoid urinary catheter unless indicated Outcome: Progressing

## 2014-07-05 NOTE — Progress Notes (Signed)
Subjective: No change in exam, doing well this AM. Good appetite, many family members at bedside.   MRI brain shows three ring-enhancing mass lesions in the right hemisphere involving the right frontal lobe, right parietal lobe, and right occipital lobe. These lesions show internal hemorrhage and contain a large amount of surrounding vasogenic edema. Also suggestive of 13 mm midline shift to the left.  Objective: Vital signs in last 24 hours: Filed Vitals:   07/04/14 2356 07/05/14 0000 07/05/14 0411 07/05/14 0741  BP: 130/51 130/51 132/50   Pulse: 57 61 60 60  Temp: 97.8 F (36.6 C)  97.9 F (36.6 C)   TempSrc: Axillary  Axillary   Resp: 20  12 19   Height:      Weight:   139 lb 8.8 oz (63.3 kg)   SpO2: 95% 96%  100%   Weight change: -5 lb 7.2 oz (-2.472 kg)  Intake/Output Summary (Last 24 hours) at 07/05/14 0744 Last data filed at 07/05/14 0500  Gross per 24 hour  Intake    660 ml  Output    310 ml  Net    350 ml   General: White male, resting in bed, NAD HEENT: Left pupil larger than right. Reactive, EOMI, no scleral icterus. Right-nasal + facial scarring 2/2 remote Moh's surgery.  Cardiac: RRR, no rubs, murmurs or gallops Pulm: Clear to auscultation bilaterally, no wheezes, rales, or rhonchi Abd: Soft, nontender, nondistended, BS present Ext: Warm and well perfused, no pedal edema Neuro: Alert and oriented X3, cranial nerves II-XII grossly intact, strength and sensation to light touch equal in bilateral upper and lower extremities. Mild word finding difficulty.  Lab Results: Basic Metabolic Panel:  Recent Labs Lab 07/03/14 1815 07/04/14 0250  NA 139 133*  K 4.3 4.2  CL 104 101  CO2 26 23  GLUCOSE 81 194*  BUN 30* 27*  CREATININE 0.98 0.86  CALCIUM 9.1 8.9  MG  --  1.9   Liver Function Tests:  Recent Labs Lab 07/03/14 1815  AST 11  ALT 9  ALKPHOS 57  BILITOT 0.2*  PROT 6.4  ALBUMIN 3.4*   CBC:  Recent Labs Lab 07/03/14 1815 07/04/14 0250    WBC 7.8 7.5  NEUTROABS 3.7 5.8  HGB 11.7* 11.4*  HCT 34.7* 33.5*  MCV 91.1 89.3  PLT 151 150   Urinalysis:  Recent Labs Lab 07/03/14 2038  COLORURINE YELLOW  LABSPEC 1.026  PHURINE 6.5  GLUCOSEU NEGATIVE  HGBUR TRACE*  BILIRUBINUR NEGATIVE  KETONESUR NEGATIVE  PROTEINUR NEGATIVE  UROBILINOGEN 1.0  NITRITE NEGATIVE  LEUKOCYTESUR NEGATIVE    Micro Results: Recent Results (from the past 240 hour(s))  MRSA PCR Screening     Status: None   Collection Time: 07/03/14  9:52 PM  Result Value Ref Range Status   MRSA by PCR NEGATIVE NEGATIVE Final    Comment:        The GeneXpert MRSA Assay (FDA approved for NASAL specimens only), is one component of a comprehensive MRSA colonization surveillance program. It is not intended to diagnose MRSA infection nor to guide or monitor treatment for MRSA infections.    Studies/Results: Dg Chest 2 View  07/03/2014   CLINICAL DATA:  Chest pain, brain metastasis on CT same day  EXAM: CHEST  2 VIEW  COMPARISON:  Chest radiograph 06/30/2014  FINDINGS: Normal cardiac silhouette. The left mid lung lesion measures 14 mm decreased from 22 mm on prior. There is airspace disease in lower lobe seen on lateral projection.  No aggressive osseous lesion.  IMPRESSION: 1. The left lung lesion is smaller than comparison exam but still concerning for lung cancer given the brain lesions. Recommend CT thorax with contrast for further evaluation.  2.  Potential left lower lobe pneumonia.   Electronically Signed   By: Suzy Bouchard M.D.   On: 07/03/2014 18:20   Ct Head Wo Contrast  07/03/2014   ADDENDUM REPORT: 07/03/2014 18:25  ADDENDUM: A chest x-ray was performed following the head CT. The left lung lesion is decreased in size over 3 day interval. This raises the concern that the pulmonary lesions could be cerebral abscesses. Metastatic disease remains in differential. Recommend MRI of the brain with and without contrast to distinguish between infection  and neoplasm.  Additional findings and recommendations conveyed toJASON MESNER on 07/03/2014 at18:24.   Electronically Signed   By: Suzy Bouchard M.D.   On: 07/03/2014 18:25   07/03/2014   CLINICAL DATA:  Gait abnormality  EXAM: CT HEAD WITHOUT CONTRAST  TECHNIQUE: Contiguous axial images were obtained from the base of the skull through the vertex without intravenous contrast.  COMPARISON:  Head CT 10/19/2005 on chest radiograph 07/30/2014  FINDINGS: There is round lesion in right frontal lobe measuring 30 mm which has centrally low-attenuation. There is a large field of a the vasogenic edema surrounding this intraparenchymal lesion. There is a subfalcine herniation in the right frontal lobe with 7 cm of leftward midline shift. There is a second lesion in the right occipital lobe measuring 2.3 cm also with a large field of vasogenic edema.  There is compression of the lateral ventricles on the right. The quadrigeminal plate cistern is effaced on the left. There is 1e mm of midline shift centrally at the level of the ventricles. No intraparenchymal hemorrhage.  IMPRESSION: 1. Metastatic lesions in the right frontal lobe and right occipital lobe with extensive vasogenic edema. 2. Moderate leftward midline shift. 3. Effacement of the quadrigeminal plate cistern on the left. Subfalcine herniation anteriorly. 4. Patient has a lesion in the left lung on chest x-ray therefore this likely represents metastatic lung cancer. Critical Value/emergent results were called by telephone at the time of interpretation on 07/03/2014 at 6:13 pm to Dr. Merrily Pew , who verbally acknowledged these results.  Electronically Signed: By: Suzy Bouchard M.D. On: 07/03/2014 18:14   Ct Chest W Contrast  07/03/2014   CLINICAL DATA:  68 year old male with lung lesion and brain mass. Evaluate for underlying malignancy.  EXAM: CT CHEST, ABDOMEN, AND PELVIS WITH CONTRAST  TECHNIQUE: Multidetector CT imaging of the chest, abdomen and  pelvis was performed following the standard protocol during bolus administration of intravenous contrast.  CONTRAST:  156mL OMNIPAQUE IOHEXOL 300 MG/ML  SOLN  COMPARISON:  No priors.  FINDINGS: CT CHEST FINDINGS  Mediastinum: Heart size is normal. There is no significant pericardial fluid, thickening or pericardial calcification. There is atherosclerosis of the thoracic aorta, the great vessels of the mediastinum and the coronary arteries, including calcified atherosclerotic plaque in the left main, left anterior descending and right coronary arteries. Left hilar lymphadenopathy measuring up to 19 x 27 mm (image 26 of series 201). No other mediastinal or right hilar adenopathy is noted. Esophagus is unremarkable in appearance. Separate origin of the left vertebral artery directly off the aortic arch (normal anatomical variant) incidentally noted.  Lungs/Pleura: Centered in the superior segment of the left lower lobe is a 4.3 x 3.7 x 4.0 cm thick-walled cavitary mass, highly suspicious for a cavitary neoplasm.  Adjacent to this within the medial aspect of the left lower lobe there is extensive airspace consolidation and septal thickening. Focal pleuroparenchymal architectural distortion in the apex of the right upper lobe is strongly favored to be chronic post infectious or inflammatory scarring. No other suspicious appearing pulmonary nodules or masses are noted at this time. There is a background of moderate centrilobular emphysema with mild diffuse bronchial wall thickening. No pleural effusions. Small amount of scarring in the inferior segment of the lingula.  Musculoskeletal: There are no aggressive appearing lytic or blastic lesions noted in the visualized portions of the skeleton.  CT ABDOMEN AND PELVIS FINDINGS  Hepatobiliary: No focal cystic or solid hepatic lesions. No intra or extrahepatic biliary ductal dilatation. Gallbladder is normal in appearance.  Pancreas: Unremarkable.  Spleen: Unremarkable.   Adrenals/Urinary Tract: Extensive atrophy in the lower pole of the right kidney, presumably from prior infection/ inflammation. Sub cm low-attenuation lesions in the kidneys bilaterally too small to characterize, but favored to represent small cysts. In addition, there is a 1.6 cm simple cyst in the interpolar region of the left kidney. No hydroureteronephrosis. Urinary bladder is normal in appearance.  Stomach/Bowel: Normal appearance of the stomach. No pathologic dilatation of small bowel or colon. Normal appendix (retrocecal).  Vascular/Lymphatic: Moderate to severe stenoses at the origins of the celiac axis and superior mesenteric arteries. Complete occlusion of the abdominal aorta immediately beneath the origin of the renal arteries. This occlusion extends into the pelvis where the common iliac arteries, internal iliac arteries and external iliac arteries are all completely occluded bilaterally. There is reconstitution of flow in the common femoral arteries bilaterally, and there are multiple collateral arteries noted in the pelvic sidewall bilaterally. IMA origin is also chronically occluded, although the distal aspect of the vessel is patent secondary to a collateral pathway which appears to involve a branch from the proximal superior mesenteric artery. No lymphadenopathy noted in the abdomen or pelvis.  Reproductive: Prostate gland and seminal vesicles are unremarkable in appearance.  Other: No significant volume of ascites.  No pneumoperitoneum.  Musculoskeletal: There are no aggressive appearing lytic or blastic lesions noted in the visualized portions of the skeleton.  IMPRESSION: 1. 4.3 x 3.7 x 4.3 cm thick-walled cavitary mass in the superior segment of the left lower lobe with associated left hilar adenopathy, and previously diagnosed brain metastases. Findings are compatible with stage IV lung cancer (T2A, N1, M1b). 2. Extensive airspace consolidation in the medial aspect of the left lower lobe.  Although this could in part related to postobstructive changes, the position of lesion argues against this. This may simply represent endobronchial spread of hemorrhage or secretions from the lesion, as lesion does appear to communicate with the airways. Alternatively, this could represent some lymphangitic spread of tumor, however, that is not strongly favored at this time. 3. 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 In addition, there is left main and 2 vessel coronary artery disease. 4. Additional incidental findings, as above.   Electronically Signed   By: Vinnie Langton M.D.   On: 07/03/2014 22:00   Mr Jeri Cos Contrast  07/04/2014   CLINICAL DATA:  Abnormal head CT with brain mass. Lung mass on chest CT. Rule out metastatic disease versus abscess.  EXAM: MRI HEAD WITHOUT AND WITH CONTRAST  TECHNIQUE: Multiplanar, multiecho pulse sequences of the brain and surrounding structures were obtained without and with intravenous contrast.  CONTRAST:  106mL MULTIHANCE GADOBENATE DIMEGLUMINE 529 MG/ML IV SOLN  COMPARISON:  CT head 07/03/2014  FINDINGS: Extensive cerebral edema is present in the right frontal and parietal lobes related to ring-enhancing mass lesions. There is mass-effect and midline shift measuring 13 mm to the left.  Right frontal lobe ring-enhancing mass lesion measures 3 x 4 cm. There is a focal thickened area of enhancement anterior to the cavitary mass. There is internal hemorrhage within the mass and a large amount of surrounding edema. The wall of the mass is a  relatively thin and enhances homogeneously  Right parietal ring-enhancing mass lesion measures 2.5 x 3.0 cm and has a large amount of surrounding edema with a mild amount of internal hemorrhage. The wall of this mass is thin and relatively homogeneous in thickness.  Small enhancing lesion in the right occipital lobe measures 8 mm in also has a small amount of hemorrhage and surrounding edema.  No enhancing lesions are seen in the left hemisphere.  Diffusion-weighted imaging reveals restricted diffusion within the 3 lesions on the right. No acute infarct is identified.  Ventricle size is normal.  Brainstem and cerebellum normal.  IMPRESSION: Three ring-enhancing mass lesions in the right hemisphere involving the right frontal lobe, right parietal lobe, and right occipital lobe. These lesions show restricted diffusion and internal hemorrhage and contain a large amount of surrounding vasogenic edema. 13 mm midline shift to the left.  Differential diagnosis for these lesions includes metastatic disease and brain abscess. Currently the patient is not have an elevated white count or fever. There is a left lower lobe lung mass with left hilar adenopathy. The findings are most likely related to carcinoma lung with metastatic disease to the brain.   Electronically Signed   By: Franchot Gallo M.D.   On: 07/04/2014 15:08   Ct Abdomen Pelvis W Contrast  07/03/2014   CLINICAL DATA:  68 year old male with lung lesion and brain mass. Evaluate for underlying malignancy.  EXAM: CT CHEST, ABDOMEN, AND PELVIS WITH CONTRAST  TECHNIQUE: Multidetector CT imaging of the chest, abdomen and pelvis was performed following the standard protocol during bolus administration of intravenous contrast.  CONTRAST:  122mL OMNIPAQUE IOHEXOL 300 MG/ML  SOLN  COMPARISON:  No priors.  FINDINGS: CT CHEST FINDINGS  Mediastinum: Heart size is normal. There is no significant pericardial fluid, thickening or pericardial calcification. There is  atherosclerosis of the thoracic aorta, the great vessels of the mediastinum and the coronary arteries, including calcified atherosclerotic plaque in the left main, left anterior descending and right coronary arteries. Left hilar lymphadenopathy measuring up to 19 x 27 mm (image 26 of series 201). No other mediastinal or right hilar adenopathy is noted. Esophagus is unremarkable in appearance. Separate origin of the left vertebral artery directly off the aortic arch (normal anatomical variant) incidentally noted.  Lungs/Pleura: Centered in the superior segment of the left lower lobe is a 4.3 x 3.7 x 4.0 cm thick-walled cavitary mass, highly suspicious for a cavitary neoplasm. Adjacent to this within the medial aspect of the left lower lobe there is extensive airspace consolidation and septal thickening. Focal pleuroparenchymal architectural distortion in the apex of the right upper lobe is strongly favored to be chronic post infectious or inflammatory  scarring. No other suspicious appearing pulmonary nodules or masses are noted at this time. There is a background of moderate centrilobular emphysema with mild diffuse bronchial wall thickening. No pleural effusions. Small amount of scarring in the inferior segment of the lingula.  Musculoskeletal: There are no aggressive appearing lytic or blastic lesions noted in the visualized portions of the skeleton.  CT ABDOMEN AND PELVIS FINDINGS  Hepatobiliary: No focal cystic or solid hepatic lesions. No intra or extrahepatic biliary ductal dilatation. Gallbladder is normal in appearance.  Pancreas: Unremarkable.  Spleen: Unremarkable.  Adrenals/Urinary Tract: Extensive atrophy in the lower pole of the right kidney, presumably from prior infection/ inflammation. Sub cm low-attenuation lesions in the kidneys bilaterally too small to characterize, but favored to represent small cysts. In addition, there is a 1.6 cm simple cyst in the interpolar region of the left kidney. No  hydroureteronephrosis. Urinary bladder is normal in appearance.  Stomach/Bowel: Normal appearance of the stomach. No pathologic dilatation of small bowel or colon. Normal appendix (retrocecal).  Vascular/Lymphatic: Moderate to severe stenoses at the origins of the celiac axis and superior mesenteric arteries. Complete occlusion of the abdominal aorta immediately beneath the origin of the renal arteries. This occlusion extends into the pelvis where the common iliac arteries, internal iliac arteries and external iliac arteries are all completely occluded bilaterally. There is reconstitution of flow in the common femoral arteries bilaterally, and there are multiple collateral arteries noted in the pelvic sidewall bilaterally. IMA origin is also chronically occluded, although the distal aspect of the vessel is patent secondary to a collateral pathway which appears to involve a branch from the proximal superior mesenteric artery. No lymphadenopathy noted in the abdomen or pelvis.  Reproductive: Prostate gland and seminal vesicles are unremarkable in appearance.  Other: No significant volume of ascites.  No pneumoperitoneum.  Musculoskeletal: There are no aggressive appearing lytic or blastic lesions noted in the visualized portions of the skeleton.  IMPRESSION: 1. 4.3 x 3.7 x 4.3 cm thick-walled cavitary mass in the superior segment of the left lower lobe with associated left hilar adenopathy, and previously diagnosed brain metastases. Findings are compatible with stage IV lung cancer (T2A, N1, M1b). 2. Extensive airspace consolidation in the medial aspect of the left lower lobe. Although this could in part related to postobstructive changes, the position of lesion argues against this. This may simply represent endobronchial spread of hemorrhage or secretions from the lesion, as lesion does appear to communicate with the airways. Alternatively, this could represent some lymphangitic spread of tumor, however, that is not  strongly favored at this time. 3. 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 In addition, there is left main and 2 vessel coronary artery disease. 4. Additional incidental findings, as above.   Electronically Signed   By: Vinnie Langton M.D.   On: 07/03/2014 22:00   Medications: I have reviewed the patient's current medications. Scheduled Meds: . dexamethasone  4 mg Intravenous 4 times per day  . lisinopril  10 mg Oral Daily  . nicotine  21 mg Transdermal Q24H  . 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 Suspect stage IV primary lung CA.   Suspected Primary Lung CA w/ Brain Metastases- Initially seen on CT head. MRI performed yesterday showed three ring-enhancing mass lesions in the right hemisphere involving the right frontal lobe, right parietal lobe, and right occipital lobe. These lesions show internal hemorrhage and contain a large amount of surrounding vasogenic edema. Also suggestive of 13 mm midline shift to the left. Lesions appear cystic in nature, on MR. Followed by Dr. Ronnald Ramp w/ neurosurgery and seen by Dr. Tammi Klippel w/ Radiation Oncology yesterday, appreciate  consult.  -Follow up neurosurgery recommendations w/ respect to possible debulking, etc. Appreciate input. If no surgery indicated, may consider brain irradiation per Dr. Johny Shears note from yesterday.  -Continue Decadron 4 mg IV q6h -HOLD DVT ppx in the setting of internal hemorrhage associated w/ brain masses -Zofran prn for nausea -Seizure precautions -Neuro checks  HTN- Normotensive -Continue lisinopril 10mg  daily  Dispo: Disposition is deferred at this time, awaiting improvement of current medical problems.  Anticipated discharge in approximately 1-2 day(s).   The patient does have a current PCP Aletha Halim, PA-C) and does need an [facility] hospital follow-up appointment after discharge.  The patient does have transportation limitations that hinder transportation to clinic appointments.  .Services Needed at time of discharge: Y = Yes, Blank = No PT:   OT:   RN:   Equipment:   Other:     LOS: 2 days   Corky Sox, MD 07/05/2014, 7:44 AM

## 2014-07-06 LAB — CBC
HCT: 33.9 % — ABNORMAL LOW (ref 39.0–52.0)
Hemoglobin: 11.5 g/dL — ABNORMAL LOW (ref 13.0–17.0)
MCH: 29.9 pg (ref 26.0–34.0)
MCHC: 33.9 g/dL (ref 30.0–36.0)
MCV: 88.3 fL (ref 78.0–100.0)
PLATELETS: 177 10*3/uL (ref 150–400)
RBC: 3.84 MIL/uL — ABNORMAL LOW (ref 4.22–5.81)
RDW: 12.6 % (ref 11.5–15.5)
WBC: 13.6 10*3/uL — ABNORMAL HIGH (ref 4.0–10.5)

## 2014-07-06 LAB — BASIC METABOLIC PANEL
Anion gap: 13 (ref 5–15)
BUN: 26 mg/dL — AB (ref 6–23)
CO2: 24 mEq/L (ref 19–32)
CREATININE: 0.8 mg/dL (ref 0.50–1.35)
Calcium: 9.1 mg/dL (ref 8.4–10.5)
Chloride: 100 mEq/L (ref 96–112)
Glucose, Bld: 157 mg/dL — ABNORMAL HIGH (ref 70–99)
Potassium: 4.4 mEq/L (ref 3.7–5.3)
Sodium: 137 mEq/L (ref 137–147)

## 2014-07-06 LAB — PROTIME-INR
INR: 1.07 (ref 0.00–1.49)
PROTHROMBIN TIME: 14 s (ref 11.6–15.2)

## 2014-07-06 LAB — HIV ANTIBODY (ROUTINE TESTING W REFLEX): HIV 1&2 Ab, 4th Generation: NONREACTIVE

## 2014-07-06 MED ORDER — BACLOFEN 5 MG HALF TABLET
5.0000 mg | ORAL_TABLET | Freq: Three times a day (TID) | ORAL | Status: DC
  Administered 2014-07-06 – 2014-07-07 (×4): 5 mg via ORAL
  Filled 2014-07-06 (×5): qty 1

## 2014-07-06 NOTE — Progress Notes (Signed)
Patient with large right frontal and right parietal occipital cystic tumors with a small occipital pole ring-enhancing lesion as well all consistent  with metastatic disease. Patient with a posterior left lower lobe lesion consistent with lung carcinoma.  My personal bias would be to perform needle biopsy of the lung lesion for tissue diagnosis. This would allow for the possibility that the tumor was a small cell carcinoma which would be treated with chemotherapy alone versus non-small cell tumors which would likely be treated with preoperative SRS treatments and then craniotomy with resection. The patient should be safely maintained on steroids while awaiting the pathology results from the lung biopsy.

## 2014-07-06 NOTE — Progress Notes (Signed)
Patient ID: Anthony Lopez, male   DOB: 02-Oct-1945, 68 y.o.   MRN: [id number] Medicine attending: Patient examined together with the medical resident team. Neurosurgery input greatly appreciated. We will proceed with a transcutaneous CT-guided needle biopsy of the left lung mass. In my experience, a peripheral cavitary lesion more likely to be adenocarcinoma or squamous cell carcinoma than small cell carcinoma. Patient remains neurologically stable. He is still having some intermittent headaches. No new focal neurologic deficit. Note chronic discrepancy in pupil size left greater than right. Both are round and reactive. We will continue Decadron. He is now experiencing hicoughs which is a side effect of high-dose Decadron. We will start him on some baclofen to help control these.

## 2014-07-06 NOTE — Progress Notes (Addendum)
Subjective: Doing well this AM, good appetite. Continued headache, well controlled w/ Oxy-IR. No new neurological changes on exam. No chest pain, SOB, or confusion.   Objective: Vital signs in last 24 hours: Filed Vitals:   07/06/14 0400 07/06/14 0835 07/06/14 0920 07/06/14 1153  BP: 133/71 147/66  135/59  Pulse: 70 61  58  Temp:   98.1 F (36.7 C) 97.5 F (36.4 C)  TempSrc:   Oral Oral  Resp: 12   17  Height:      Weight:      SpO2: 97% 97%  96%   Weight change: 1 lb 15.8 oz (0.9 kg)  Intake/Output Summary (Last 24 hours) at 07/06/14 1220 Last data filed at 07/06/14 4098  Gross per 24 hour  Intake    240 ml  Output   2225 ml  Net  -1985 ml   General: White male, resting in bed, NAD. HEENT: Left pupil slightly larger than right (chronic, according to patient/family). Reactive, EOMI, no scleral icterus. Right-nasal + facial scarring 2/2 remote h/o Moh's surgery.  Cardiac: RRR, no rubs, murmurs or gallops Pulm: Clear to auscultation bilaterally, no wheezes, rales, or rhonchi Abd: Soft, nontender, nondistended, BS present Ext: Warm and well perfused, no pedal edema Neuro: Alert and oriented X3, cranial nerves II-XII grossly intact, strength and sensation to light touch equal in bilateral upper and lower extremities. Mild word finding difficulty.  Lab Results: Basic Metabolic Panel:  Recent Labs Lab 07/04/14 0250 07/05/14 0950 07/06/14 0215  NA 133* 137 137  K 4.2 4.4 4.4  CL 101 101 100  CO2 23 25 24   GLUCOSE 194* 209* 157*  BUN 27* 24* 26*  CREATININE 0.86 0.81 0.80  CALCIUM 8.9 9.2 9.1  MG 1.9  --   --    Liver Function Tests:  Recent Labs Lab 07/03/14 1815  AST 11  ALT 9  ALKPHOS 57  BILITOT 0.2*  PROT 6.4  ALBUMIN 3.4*   CBC:  Recent Labs Lab 07/03/14 1815 07/04/14 0250 07/05/14 0950 07/06/14 0215  WBC 7.8 7.5 14.0* 13.6*  NEUTROABS 3.7 5.8  --   --   HGB 11.7* 11.4* 11.3* 11.5*  HCT 34.7* 33.5* 33.1* 33.9*  MCV 91.1 89.3 88.5 88.3    PLT 151 150 183 177   Urinalysis:  Recent Labs Lab 07/03/14 2038  COLORURINE YELLOW  LABSPEC 1.026  PHURINE 6.5  GLUCOSEU NEGATIVE  HGBUR TRACE*  BILIRUBINUR NEGATIVE  KETONESUR NEGATIVE  PROTEINUR NEGATIVE  UROBILINOGEN 1.0  NITRITE NEGATIVE  LEUKOCYTESUR NEGATIVE    Micro Results: Recent Results (from the past 240 hour(s))  MRSA PCR Screening     Status: None   Collection Time: 07/03/14  9:52 PM  Result Value Ref Range Status   MRSA by PCR NEGATIVE NEGATIVE Final    Comment:        The GeneXpert MRSA Assay (FDA approved for NASAL specimens only), is one component of a comprehensive MRSA colonization surveillance program. It is not intended to diagnose MRSA infection nor to guide or monitor treatment for MRSA infections.    Studies/Results: Mr Jeri Cos Contrast  07/04/2014   CLINICAL DATA:  Abnormal head CT with brain mass. Lung mass on chest CT. Rule out metastatic disease versus abscess.  EXAM: MRI HEAD WITHOUT AND WITH CONTRAST  TECHNIQUE: Multiplanar, multiecho pulse sequences of the brain and surrounding structures were obtained without and with intravenous contrast.  CONTRAST:  67mL MULTIHANCE GADOBENATE DIMEGLUMINE 529 MG/ML IV SOLN  COMPARISON:  CT head 07/03/2014  FINDINGS: Extensive cerebral edema is present in the right frontal and parietal lobes related to ring-enhancing mass lesions. There is mass-effect and midline shift measuring 13 mm to the left.  Right frontal lobe ring-enhancing mass lesion measures 3 x 4 cm. There is a focal thickened area of enhancement anterior to the cavitary mass. There is internal hemorrhage within the mass and a large amount of surrounding edema. The wall of the mass is a relatively thin and enhances homogeneously  Right parietal ring-enhancing mass lesion measures 2.5 x 3.0 cm and has a large amount of surrounding edema with a mild amount of internal hemorrhage. The wall of this mass is thin and relatively homogeneous in  thickness.  Small enhancing lesion in the right occipital lobe measures 8 mm in also has a small amount of hemorrhage and surrounding edema.  No enhancing lesions are seen in the left hemisphere.  Diffusion-weighted imaging reveals restricted diffusion within the 3 lesions on the right. No acute infarct is identified.  Ventricle size is normal.  Brainstem and cerebellum normal.  IMPRESSION: Three ring-enhancing mass lesions in the right hemisphere involving the right frontal lobe, right parietal lobe, and right occipital lobe. These lesions show restricted diffusion and internal hemorrhage and contain a large amount of surrounding vasogenic edema. 13 mm midline shift to the left.  Differential diagnosis for these lesions includes metastatic disease and brain abscess. Currently the patient is not have an elevated white count or fever. There is a left lower lobe lung mass with left hilar adenopathy. The findings are most likely related to carcinoma lung with metastatic disease to the brain.   Electronically Signed   By: Franchot Gallo M.D.   On: 07/04/2014 15:08   Medications: I have reviewed the patient's current medications. Scheduled Meds: . baclofen  5 mg Oral TID  . dexamethasone  4 mg Intravenous 4 times per day  . lisinopril  10 mg Oral Daily  . nicotine  21 mg Transdermal Q24H  . 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 Suspect metastatic primary lung CA.   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 Given extensive smoking history, suspect this is primary lung CA w/ brain mets. Seen today by neurosurgery, suggested lung biopsy at this time rather than neurosurgical intervention.  -For CT-guided lung biopsy tomorrow; will discuss w/ IR.  -Continue Decadron 4 mg IV q6h -Continue to HOLD DVT ppx in the setting of internal hemorrhage associated w/ brain masses -Zofran prn for nausea -Seizure precautions -Continue frequent Neuro checks  HTN- Normotensive -Continue lisinopril 10mg  daily  Dispo: Disposition is deferred at this time, awaiting improvement of current medical problems.  Anticipated discharge in approximately 1-2 day(s).   The patient does have a current PCP Aletha Halim, PA-C) and does need an [facility] follow-up appointment after discharge.  The patient does have transportation limitations that hinder transportation to clinic appointments.  .Services Needed at time of discharge: Y =  Yes, Blank = No PT:   OT:   RN:   Equipment:   Other:     LOS: 3 days   Corky Sox, MD 07/06/2014, 12:20 PM  ADDENDUM: Spoke w/ Dr. Vernard Gambles, patient will go for CT biopsy tomorrow.

## 2014-07-07 ENCOUNTER — Inpatient Hospital Stay (HOSPITAL_COMMUNITY): Payer: Medicare HMO

## 2014-07-07 ENCOUNTER — Encounter (HOSPITAL_COMMUNITY): Payer: Self-pay | Admitting: Radiology

## 2014-07-07 ENCOUNTER — Ambulatory Visit
Admit: 2014-07-07 | Discharge: 2014-07-07 | Disposition: A | Discharge status: Home / Self Care | Payer: Medicare HMO | Attending: Radiation Oncology | Admitting: Radiation Oncology

## 2014-07-07 DIAGNOSIS — Z510 Encounter for antineoplastic radiation therapy: Secondary | ICD-10-CM | POA: Insufficient documentation

## 2014-07-07 DIAGNOSIS — Z7952 Long term (current) use of systemic steroids: Secondary | ICD-10-CM | POA: Insufficient documentation

## 2014-07-07 DIAGNOSIS — C7931 Secondary malignant neoplasm of brain: Secondary | ICD-10-CM | POA: Insufficient documentation

## 2014-07-07 DIAGNOSIS — C3492 Malignant neoplasm of unspecified part of left bronchus or lung: Secondary | ICD-10-CM | POA: Insufficient documentation

## 2014-07-07 LAB — BASIC METABOLIC PANEL
Anion gap: 11 (ref 5–15)
BUN: 30 mg/dL — ABNORMAL HIGH (ref 6–23)
CALCIUM: 8.7 mg/dL (ref 8.4–10.5)
CO2: 25 mEq/L (ref 19–32)
CREATININE: 0.8 mg/dL (ref 0.50–1.35)
Chloride: 99 mEq/L (ref 96–112)
GFR calc Af Amer: >90 mL/min (ref >90)
GLUCOSE: 179 mg/dL — AB (ref 70–99)
Potassium: 4.5 mEq/L (ref 3.7–5.3)
SODIUM: 135 meq/L — AB (ref 137–147)

## 2014-07-07 LAB — CBC
HCT: 34.3 % — ABNORMAL LOW (ref 39.0–52.0)
Hemoglobin: 11.8 g/dL — ABNORMAL LOW (ref 13.0–17.0)
MCH: 30.2 pg (ref 26.0–34.0)
MCHC: 34.4 g/dL (ref 30.0–36.0)
MCV: 87.7 fL (ref 78.0–100.0)
Platelets: 195 10*3/uL (ref 150–400)
RBC: 3.91 MIL/uL — ABNORMAL LOW (ref 4.22–5.81)
RDW: 12.3 % (ref 11.5–15.5)
WBC: 12 10*3/uL — ABNORMAL HIGH (ref 4.0–10.5)

## 2014-07-07 MED ORDER — FENTANYL CITRATE 0.05 MG/ML IJ SOLN
INTRAMUSCULAR | Status: DC
Start: 2014-07-07 — End: 2014-07-07
  Filled 2014-07-07: qty 2

## 2014-07-07 MED ORDER — FENTANYL CITRATE 0.05 MG/ML IJ SOLN
INTRAMUSCULAR | Status: AC | PRN
  Administered 2014-07-07: 50 ug via INTRAVENOUS

## 2014-07-07 MED ORDER — DEXAMETHASONE 4 MG PO TABS: 4.0000 mg | ORAL_TABLET | Freq: Three times a day (TID) | ORAL | Status: DC

## 2014-07-07 MED ORDER — BACLOFEN 5 MG HALF TABLET: 5.0000 mg | ORAL_TABLET | Freq: Three times a day (TID) | ORAL | Status: DC

## 2014-07-07 MED ORDER — LIDOCAINE HCL 1 % IJ SOLN
INTRAMUSCULAR | Status: AC
  Filled 2014-07-07: qty 20

## 2014-07-07 MED ORDER — MIDAZOLAM HCL 2 MG/2ML IJ SOLN
INTRAMUSCULAR | Status: AC
  Filled 2014-07-07: qty 2

## 2014-07-07 MED ORDER — NICOTINE 21 MG/24HR TD PT24: 21.0000 mg | MEDICATED_PATCH | TRANSDERMAL | Status: DC

## 2014-07-07 MED ORDER — LORAZEPAM 2 MG/ML IJ SOLN
INTRAMUSCULAR | Status: AC | PRN
  Administered 2014-07-07: 1 mg via INTRAVENOUS

## 2014-07-07 NOTE — Progress Notes (Signed)
  Radiation Oncology         [phone] ________________________________  Name: Anthony Lopez  MRN: [id number]  Date: 07/03/2014  DOB: [birth year]  Chart Note:  I reviewed this patient's most recent notes.  Agree, following biopsy and subsequent observation, if patient is stable he can potentially discharged to home, and we will follow-up as outpatient in rad-onc Wednesday or Thursday (when biopsy resulted) to help coordinate treatment.  ________________________________  Sheral Apley. Tammi Klippel, M.D.

## 2014-07-07 NOTE — Progress Notes (Signed)
Utilization Review Completed.  

## 2014-07-07 NOTE — Progress Notes (Signed)
Internal Medicine Attending  Date: 07/07/2014  Patient name: Anthony Lopez Medical record number: [id number] Date of birth: 31-Aug-1945 Age: 68 y.o. Gender: male  I saw and evaluated the patient, and discussed his care with resident on A.M rounds.  I reviewed the resident's note by Dr. Genene Churn and I agree with the resident's findings and plans as documented in his note.

## 2014-07-07 NOTE — Progress Notes (Signed)
Subjective:  Anthony Lopez is better with baclofen. Continues to have mild headache. Is going for CT guided needle biopsy of LLL cavitary mass today. Coughing sometimes, no numbness, weakness, tingling, vision changes, n/v.   Objective: Vital signs in last 24 hours: Filed Vitals:   07/06/14 2022 07/07/14 0004 07/07/14 0403 07/07/14 0737  BP: 139/64 126/86 155/74 151/68  Pulse: 60 57 51 60  Temp: 97.9 F (36.6 C) 97.7 F (36.5 C) 97.1 F (36.2 C) 97.6 F (36.4 C)  TempSrc: Oral Oral Oral Oral  Resp: 12 17 13 16   Height:      Weight:   61.5 kg (135 lb 9.3 oz)   SpO2: 96% 97% 98% 98%   Weight change: -2.7 kg (-5 lb 15.2 oz)  Intake/Output Summary (Last 24 hours) at 07/07/14 0803 Last data filed at 07/07/14 0740  Gross per 24 hour  Intake    920 ml  Output   1775 ml  Net   -855 ml   Vitals reviewed. General: resting in bed, NAD HEENT: left pupil larger than right for last several days. Not sure if baseline. Reactive, EOMI, no scleral icterus Cardiac: RRR, no rubs, murmurs or gallops Pulm: clear to auscultation bilaterally, no wheezes, rales, or rhonchi Abd: soft, nontender, nondistended, BS present Ext: warm and well perfused, no pedal edema Neuro: alert and oriented X3, cranial nerves II-XII grossly intact, strength and sensation to light touch equal in bilateral upper and lower extremities. Some word finding difficulty but good calculation function.  Lab Results: Basic Metabolic Panel:  Recent Labs Lab 07/04/14 0250  07/06/14 0215 07/07/14 0236  NA 133*  < > 137 135*  K 4.2  < > 4.4 4.5  CL 101  < > 100 99  CO2 23  < > 24 25  GLUCOSE 194*  < > 157* 179*  BUN 27*  < > 26* 30*  CREATININE 0.86  < > 0.80 0.80  CALCIUM 8.9  < > 9.1 8.7  MG 1.9  --   --   --   < > = values in this interval not displayed. Liver Function Tests:  Recent Labs Lab 07/03/14 1815  AST 11  ALT 9  ALKPHOS 57  BILITOT 0.2*  PROT 6.4  ALBUMIN 3.4*   No results for input(s): LIPASE,  AMYLASE in the last 168 hours. No results for input(s): AMMONIA in the last 168 hours. CBC:  Recent Labs Lab 07/03/14 1815 07/04/14 0250  07/06/14 0215 07/07/14 0236  WBC 7.8 7.5  < > 13.6* 12.0*  NEUTROABS 3.7 5.8  --   --   --   HGB 11.7* 11.4*  < > 11.5* 11.8*  HCT 34.7* 33.5*  < > 33.9* 34.3*  MCV 91.1 89.3  < > 88.3 87.7  PLT 151 150  < > 177 195  < > = values in this interval not displayed. Cardiac Enzymes: No results for input(s): CKTOTAL, CKMB, CKMBINDEX, TROPONINI in the last 168 hours. BNP: No results for input(s): PROBNP in the last 168 hours. D-Dimer: No results for input(s): DDIMER in the last 168 hours. CBG: No results for input(s): GLUCAP in the last 168 hours. Hemoglobin A1C: No results for input(s): HGBA1C in the last 168 hours. Fasting Lipid Panel: No results for input(s): CHOL, HDL, LDLCALC, TRIG, CHOLHDL, LDLDIRECT in the last 168 hours. Thyroid Function Tests: No results for input(s): TSH, T4TOTAL, FREET4, T3FREE, THYROIDAB in the last 168 hours. Coagulation:  Recent Labs Lab 07/06/14 1545  LABPROT 14.0  INR 1.07   Anemia Panel: No results for input(s): VITAMINB12, FOLATE, FERRITIN, TIBC, IRON, RETICCTPCT in the last 168 hours. Urine Drug Screen: Drugs of Abuse  No results found for: LABOPIA, COCAINSCRNUR, LABBENZ, AMPHETMU, THCU, LABBARB  Alcohol Level: No results for input(s): ETH in the last 168 hours. Urinalysis:  Recent Labs Lab 07/03/14 2038  COLORURINE YELLOW  LABSPEC 1.026  PHURINE 6.5  GLUCOSEU NEGATIVE  HGBUR TRACE*  BILIRUBINUR NEGATIVE  KETONESUR NEGATIVE  PROTEINUR NEGATIVE  UROBILINOGEN 1.0  NITRITE NEGATIVE  LEUKOCYTESUR NEGATIVE   Misc. Labs:  Micro Results: Recent Results (from the past 240 hour(s))  MRSA PCR Screening     Status: None   Collection Time: 07/03/14  9:52 PM  Result Value Ref Range Status   MRSA by PCR NEGATIVE NEGATIVE Final    Comment:        The GeneXpert MRSA Assay (FDA approved for NASAL  specimens only), is one component of a comprehensive MRSA colonization surveillance program. It is not intended to diagnose MRSA infection nor to guide or monitor treatment for MRSA infections.    Studies/Results: No results found. Medications: I have reviewed the patient's current medications. Scheduled Meds: . baclofen  5 mg Oral TID  . dexamethasone  4 mg Intravenous 4 times per day  . lisinopril  10 mg Oral Daily  . nicotine  21 mg Transdermal Q24H  . sodium chloride  3 mL Intravenous Q12H   Continuous Infusions:  PRN Meds:.sodium chloride, acetaminophen **OR** acetaminophen, ondansetron (ZOFRAN) IV, oxyCODONE Assessment/Plan: Principal Problem:   Lung cancer Active Problems:   Tobacco abuse   HTN (hypertension)   Brain edema   Brain metastasis   Abnormality of gait   Headache   Lesion of lung  68 yo male with hx of basal cell carcinoma, 125pack year smoking hx here with ataxia, headache, hemoptysis found to have cavitary lung lesion and likely mets to the brain.  Suspected Primary Lung Cancer with Brain mets - Lung cavitary mass in LLL on CT and with possible brain mets right frontal lobe and right occipital lobe with extensive vasogenic edema and midline shift leftward seen on head CT. This was better characterized on brain MRI showing 3 ring-enhacing mass in right frontal, right parietal, and right occpital lobes, with itnernal hemorrhage and large vasogenic edema, and midline shift to left.  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 Also could be adenocarcinoma given peripheral location on lung.  - neurosurgery and radiation onc consulted and on board. Follow their recs: first get tissue biopsy from CT guided needle biopsy of the left lung mass. If it's small cell, then would be treated with cehmo alone vs othe types would be treated with stereotactic radiation surgery followed by craniotomy . -  CTguided lung biopsy today. Can likely go home after biopsy done vs keeping him here for further management if he is stable per rad onc note. He can follow up with them with biopsy results either Wednesday or Thursday.  - continue decadron 4mg  IV q6hr for vasogenic edema. Has hiccups likely 2/2 to decadron so continue baclofen to help with hiccups. If goes home. Will switch to 4mg  PO q6hr decadron.  - continue zofran for nausea - seizure precautions, frequent neuro checks  HTN - under goal <150 - Continue lisinopril 10mg  daily  Dispo: Disposition is deferred at this time, awaiting improvement of current medical problems.  Anticipated discharge in approximately 1-2 day(s).  The patient does have a current PCP Aletha Halim, PA-C) and does need an [facility] hospital follow-up appointment after discharge.  The patient does have transportation limitations that hinder transportation to clinic appointments.  .Services Needed at time of discharge: Y = Yes, Blank = No PT:   OT:   RN:   Equipment:   Other:     LOS: 4 days   Dellia Nims, MD 07/07/2014, 8:03 AM

## 2014-07-07 NOTE — Progress Notes (Signed)
Reason for Consult:Lung mass biopsy Consulting Radiologist: Hoss Referring Physician: Beryle Beams   HPI: Anthony Lopez is an 68 y.o. male who is admitted and found to have left lung mass with evidence of suspected metastatic brain lesions as well. IR is requested to obtain tissue diagnosis by way of lung biopsy. Images reviewed. PMHx, meds, labs reviewed. Pt has been NPO this am. No recent blood thinners  Past Medical History:  Past Medical History  Diagnosis Date  . CVA (cerebral infarction) 2000  . HTN (hypertension), benign   . Rheumatic fever     Age 55 or 30  . Tobacco abuse     Surgical History:  Past Surgical History  Procedure Laterality Date  . Transthoracic echocardiogram  January 2004    Normal LV size and function. EF 55-65%.  . Carotid dopplers Bilateral 07/18/2013    Right subclavian 0-49%, left subclavian mild bilateral carotid stenosis..  . 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   . Mohs surgery      h/o NMSC (scc or bcc)    Family History:  Family History  Problem Relation Age of Onset  . Diabetes Mother   . Diabetes Father   . Heart attack Father   . Skin cancer Mother   . Hypertension Daughter     Social History:  reports that he has been smoking Cigarettes.  He has a 50 pack-year smoking history. He does not have any smokeless tobacco history on file. He reports that he does not drink alcohol. His drug history is not on file.  Allergies:  Allergies  Allergen Reactions  . Doxycycline     Stay asleep   . Penicillins Other (See Comments)    unspecified    Medications: Current facility-administered medications: 0.9 %  sodium chloride infusion, , Intravenous, PRN, Annia Belt, MD, Last Rate: 20 mL/hr at 07/04/14 1200;  acetaminophen (TYLENOL) tablet 650 mg, 650 mg, Oral, Q6H PRN, 650 mg at 07/07/14 0859 **OR** acetaminophen (TYLENOL)  suppository 650 mg, 650 mg, Rectal, Q6H PRN, Cresenciano Genre, MD;  baclofen (LIORESAL) tablet 5 mg, 5 mg, Oral, TID, Corky Sox, MD, 5 mg at 07/07/14 0858 dexamethasone (DECADRON) injection 4 mg, 4 mg, Intravenous, 4 times per day, Cresenciano Genre, MD, 4 mg at 07/07/14 5176;  lisinopril (PRINIVIL,ZESTRIL) tablet 10 mg, 10 mg, Oral, Daily, Cresenciano Genre, MD, 10 mg at 07/07/14 0859;  nicotine (NICODERM CQ - dosed in mg/24 hours) patch 21 mg, 21 mg, Transdermal, Q24H, Annia Belt, MD, 21 mg at 07/07/14 0003;  ondansetron [facility]) injection 4 mg, 4 mg, Intravenous, Q6H PRN, Cresenciano Genre, MD oxyCODONE (Oxy IR/ROXICODONE) immediate release tablet 5 mg, 5 mg, Oral, Q6H PRN, Corky Sox, MD, 5 mg at 07/05/14 2133;  sodium chloride 0.9 % injection 3 mL, 3 mL, Intravenous, Q12H, Cresenciano Genre, MD, 3 mL at 07/06/14 2130   ROS: See HPI for pertinent findings, otherwise complete 10 system review negative.  Physical Exam: Blood pressure 147/76, pulse 60, temperature 97.6 F (36.4 C), temperature source Oral, resp. rate 16, height _0  (1.778 m), weight 135 lb 9.3 oz (61.5 kg), SpO2 98 %. General: NAD, A&O x 3 ENT: unremarkable airway Lungs: CTA without w/r/r Heart: Regular   Labs: Results for orders placed or performed during the hospital encounter of 07/03/14 (from the past 48 hour(s))  Basic metabolic panel  Status: Abnormal   Collection Time: 07/05/14  9:50 AM  Result Value Ref Range   Sodium 137 137 - 147 mEq/L   Potassium 4.4 3.7 - 5.3 mEq/L   Chloride 101 96 - 112 mEq/L   CO2 25 19 - 32 mEq/L   Glucose, Bld 209 (H) 70 - 99 mg/dL   BUN 24 (H) 6 - 23 mg/dL   Creatinine, Ser 0.81 0.50 - 1.35 mg/dL   Calcium 9.2 8.4 - 10.5 mg/dL   GFR calc non Af Amer >90 >90 mL/min   GFR calc Af Amer >90 >90 mL/min    Comment: (NOTE) The eGFR has been calculated using the CKD EPI equation. This calculation has not been validated in all clinical situations. eGFR's persistently <90 mL/min  signify possible Chronic Kidney Disease.    Anion gap 11 5 - 15  CBC     Status: Abnormal   Collection Time: 07/05/14  9:50 AM  Result Value Ref Range   WBC 14.0 (H) 4.0 - 10.5 K/uL   RBC 3.74 (L) 4.22 - 5.81 MIL/uL   Hemoglobin 11.3 (L) 13.0 - 17.0 g/dL   HCT 33.1 (L) 39.0 - 52.0 %   MCV 88.5 78.0 - 100.0 fL   MCH 30.2 26.0 - 34.0 pg   MCHC 34.1 30.0 - 36.0 g/dL   RDW 12.6 11.5 - 15.5 %   Platelets 183 150 - 400 K/uL  HIV antibody     Status: None   Collection Time: 07/05/14  9:50 AM  Result Value Ref Range   HIV 1&2 Ab, 4th Generation NONREACTIVE NONREACTIVE    Comment: (NOTE) A NONREACTIVE HIV Ag/Ab result does not exclude HIV infection since the time frame for seroconversion is variable. If acute HIV infection is suspected, a HIV-1 RNA Qualitative TMA test is recommended. HIV-1/2 Antibody Diff         Not indicated. HIV-1 RNA, Qual TMA           Not indicated. PLEASE NOTE: This information has been disclosed to you from records whose confidentiality may be protected by state law. If your state requires such protection, then the state law prohibits you from making any further disclosure of the information without the specific written consent of the person to whom it pertains, or as otherwise permitted by law. A general authorization for the release of medical or other information is NOT sufficient for this purpose. The performance of this assay has not been clinically validated in patients less than 65 years old. Performed at [place] metabolic panel     Status: Abnormal   Collection Time: 07/06/14  2:15 AM  Result Value Ref Range   Sodium 137 137 - 147 mEq/L   Potassium 4.4 3.7 - 5.3 mEq/L   Chloride 100 96 - 112 mEq/L   CO2 24 19 - 32 mEq/L   Glucose, Bld 157 (H) 70 - 99 mg/dL   BUN 26 (H) 6 - 23 mg/dL   Creatinine, Ser 0.80 0.50 - 1.35 mg/dL   Calcium 9.1 8.4 - 10.5 mg/dL   GFR calc non Af Amer >90 >90 mL/min   GFR calc Af Amer >90 >90 mL/min     Comment: (NOTE) The eGFR has been calculated using the CKD EPI equation. This calculation has not been validated in all clinical situations. eGFR's persistently <90 mL/min signify possible Chronic Kidney Disease.    Anion gap 13 5 - 15  CBC     Status: Abnormal  Collection Time: 07/06/14  2:15 AM  Result Value Ref Range   WBC 13.6 (H) 4.0 - 10.5 K/uL   RBC 3.84 (L) 4.22 - 5.81 MIL/uL   Hemoglobin 11.5 (L) 13.0 - 17.0 g/dL   HCT 33.9 (L) 39.0 - 52.0 %   MCV 88.3 78.0 - 100.0 fL   MCH 29.9 26.0 - 34.0 pg   MCHC 33.9 30.0 - 36.0 g/dL   RDW 12.6 11.5 - 15.5 %   Platelets 177 150 - 400 K/uL  Protime-INR     Status: None   Collection Time: 07/06/14  3:45 PM  Result Value Ref Range   Prothrombin Time 14.0 11.6 - 15.2 seconds   INR 1.07 0.00 - 1.49  CBC     Status: Abnormal   Collection Time: 07/07/14  2:36 AM  Result Value Ref Range   WBC 12.0 (H) 4.0 - 10.5 K/uL   RBC 3.91 (L) 4.22 - 5.81 MIL/uL   Hemoglobin 11.8 (L) 13.0 - 17.0 g/dL   HCT 34.3 (L) 39.0 - 52.0 %   MCV 87.7 78.0 - 100.0 fL   MCH 30.2 26.0 - 34.0 pg   MCHC 34.4 30.0 - 36.0 g/dL   RDW 12.3 11.5 - 15.5 %   Platelets 195 150 - 400 K/uL  Basic metabolic panel     Status: Abnormal   Collection Time: 07/07/14  2:36 AM  Result Value Ref Range   Sodium 135 (L) 137 - 147 mEq/L   Potassium 4.5 3.7 - 5.3 mEq/L   Chloride 99 96 - 112 mEq/L   CO2 25 19 - 32 mEq/L   Glucose, Bld 179 (H) 70 - 99 mg/dL   BUN 30 (H) 6 - 23 mg/dL   Creatinine, Ser 0.80 0.50 - 1.35 mg/dL   Calcium 8.7 8.4 - 10.5 mg/dL   GFR calc non Af Amer >90 >90 mL/min   GFR calc Af Amer >90 >90 mL/min    Comment: (NOTE) The eGFR has been calculated using the CKD EPI equation. This calculation has not been validated in all clinical situations. eGFR's persistently <90 mL/min signify possible Chronic Kidney Disease.    Anion gap 11 5 - 15    PT/INR  Recent Labs  07/06/14 1545  LABPROT 14.0  INR 1.07       No results  found.  Assessment/Plan: Left lung mass with suspicious brain lesions For CT guided biopsy of left lower lung mass Discussed procedure, risks/complications including PTX/chest tube, hemorrhage/hemoptysis, resp failure or even fatal complication. Labs reviewed, ok Consent signed in chart  Ascencion Dike PA-C 07/07/2014, 9:36 AM

## 2014-07-07 NOTE — Discharge Summary (Signed)
Name: Anthony Lopez MRN: [id number] DOB: Mar 26, 1946 68 y.o. PCP: Aletha Halim, PA-C  Date of Admission: 07/03/2014  5:04 PM Date of Discharge: 07/07/2014 Attending Physician: Annia Belt, MD  Discharge Diagnosis:  Principal Problem:   Lung cancer Active Problems:   Tobacco abuse   HTN (hypertension)   Brain edema   Brain metastasis   Abnormality of gait   Headache   Lesion of lung  Discharge Medications:   Medication List    STOP taking these medications        aspirin 81 MG tablet      TAKE these medications        baclofen 5 mg Tabs tablet  Commonly known as:  LIORESAL  Take 0.5 tablets (5 mg total) by mouth 3 (three) times daily.     dexamethasone 4 MG tablet  Commonly known as:  DECADRON  Take 1 tablet (4 mg total) by mouth 3 (three) times daily.     Fish Oil 1000 MG Caps  Take by mouth.     lisinopril 10 MG tablet  Commonly known as:  PRINIVIL,ZESTRIL  Take 10 mg by mouth daily.     nicotine 21 mg/24hr patch  Commonly known as:  NICODERM CQ - dosed in mg/24 hours  Place 1 patch (21 mg total) onto the skin daily.        Disposition and follow-up:   Anthony Lopez was discharged from [facility] in Stable condition.  At the hospital follow up visit please address:  1.  Please make sure he follows up with radiation oncology Dr. Tammi Klippel, med onc Dr. Inda Merlin, and neurosurgery Dr. Ronnald Ramp outpatient.  Please arrange these follow up if needed but they should be calling him for appointments from the clinics. Please follow up with biopsy results from lung mass biopsy.  We also started decadron in the hospital and continuing 4mg  TID at home. Please see when he can stop this. He will need to be on it for now because of vasogenic edema around his brain masses.  2.  Labs / imaging needed at time of follow-up:   3.  Pending labs/ test needing follow-up: lung mass biopsy results  Follow-up Appointments:     Follow-up  Information    Follow up with KAPLAN,KRISTEN, PA-C.   Specialty:  Family Medicine   Contact information:   [address] [place] [address] [phone]       Follow up with Eilleen Kempf., MD.   Specialty:  Oncology   Why:  office will call you   Contact information:   [place] [address] [phone]       Follow up with Vilinda Boehringer, MD On 07/15/2014.   Specialty:  Internal Medicine   Why:  at 1:30 pm   Contact information:   [place] [address] [phone]       Follow up with Lora Paula, MD.   Specialty:  Radiation Oncology   Why:  please make appointment for next thursday. I tried to call to make appt but couldn't reach them.   Contact information:   [place] [address] [phone]       Follow up with Eustace Moore, MD. Schedule an appointment as soon as possible for a visit in 2 weeks.   Specialty:  Neurosurgery   Contact information:   [address] [place] [address] [phone]  Discharge Instructions:   Consultations: Treatment Team:  Eustace Moore, MD Dr. Tammi Klippel rad onc  Procedures Performed:  Dg Chest 1 View  07/07/2014   CLINICAL DATA:  Status post left lung biopsy  EXAM: CHEST - 1 VIEW  COMPARISON:  07/07/2014  FINDINGS: Status post left lung biopsy. Left lower lobe reticular nodular interstitial disease with a cavitary mass in the superior segment of the left lower lobe. The right lung is clear. There is no pneumothorax. Stable cardiomediastinal silhouette. Unremarkable osseous structures.  IMPRESSION: 1. No left pneumothorax status post left lung biopsy.   Electronically Signed   By: Kathreen Devoid   On: 07/07/2014 15:53   Dg Chest 2 View  07/03/2014   CLINICAL DATA:  Chest pain, brain metastasis on CT same day  EXAM: CHEST  2 VIEW  COMPARISON:  Chest radiograph 06/30/2014  FINDINGS: Normal cardiac silhouette. The left mid lung  lesion measures 14 mm decreased from 22 mm on prior. There is airspace disease in lower lobe seen on lateral projection. No aggressive osseous lesion.  IMPRESSION: 1. The left lung lesion is smaller than comparison exam but still concerning for lung cancer given the brain lesions. Recommend CT thorax with contrast for further evaluation.  2.  Potential left lower lobe pneumonia.   Electronically Signed   By: Suzy Bouchard M.D.   On: 07/03/2014 18:20   Ct Head Wo Contrast  07/03/2014   ADDENDUM REPORT: 07/03/2014 18:25  ADDENDUM: A chest x-ray was performed following the head CT. The left lung lesion is decreased in size over 3 day interval. This raises the concern that the pulmonary lesions could be cerebral abscesses. Metastatic disease remains in differential. Recommend MRI of the brain with and without contrast to distinguish between infection and neoplasm.  Additional findings and recommendations conveyed toJASON MESNER on 07/03/2014 at18:24.   Electronically Signed   By: Suzy Bouchard M.D.   On: 07/03/2014 18:25   07/03/2014   CLINICAL DATA:  Gait abnormality  EXAM: CT HEAD WITHOUT CONTRAST  TECHNIQUE: Contiguous axial images were obtained from the base of the skull through the vertex without intravenous contrast.  COMPARISON:  Head CT 10/19/2005 on chest radiograph 07/30/2014  FINDINGS: There is round lesion in right frontal lobe measuring 30 mm which has centrally low-attenuation. There is a large field of a the vasogenic edema surrounding this intraparenchymal lesion. There is a subfalcine herniation in the right frontal lobe with 7 cm of leftward midline shift. There is a second lesion in the right occipital lobe measuring 2.3 cm also with a large field of vasogenic edema.  There is compression of the lateral ventricles on the right. The quadrigeminal plate cistern is effaced on the left. There is 1e mm of midline shift centrally at the level of the ventricles. No intraparenchymal hemorrhage.   IMPRESSION: 1. Metastatic lesions in the right frontal lobe and right occipital lobe with extensive vasogenic edema. 2. Moderate leftward midline shift. 3. Effacement of the quadrigeminal plate cistern on the left. Subfalcine herniation anteriorly. 4. Patient has a lesion in the left lung on chest x-ray therefore this likely represents metastatic lung cancer. Critical Value/emergent results were called by telephone at the time of interpretation on 07/03/2014 at 6:13 pm to Dr. Merrily Pew , who verbally acknowledged these results.  Electronically Signed: By: Suzy Bouchard M.D. On: 07/03/2014 18:14   Ct Chest W Contrast  07/03/2014   CLINICAL DATA:  68 year old male with lung lesion and brain mass. Evaluate for  underlying malignancy.  EXAM: CT CHEST, ABDOMEN, AND PELVIS WITH CONTRAST  TECHNIQUE: Multidetector CT imaging of the chest, abdomen and pelvis was performed following the standard protocol during bolus administration of intravenous contrast.  CONTRAST:  146mL OMNIPAQUE IOHEXOL 300 MG/ML  SOLN  COMPARISON:  No priors.  FINDINGS: CT CHEST FINDINGS  Mediastinum: Heart size is normal. There is no significant pericardial fluid, thickening or pericardial calcification. There is atherosclerosis of the thoracic aorta, the great vessels of the mediastinum and the coronary arteries, including calcified atherosclerotic plaque in the left main, left anterior descending and right coronary arteries. Left hilar lymphadenopathy measuring up to 19 x 27 mm (image 26 of series 201). No other mediastinal or right hilar adenopathy is noted. Esophagus is unremarkable in appearance. Separate origin of the left vertebral artery directly off the aortic arch (normal anatomical variant) incidentally noted.  Lungs/Pleura: Centered in the superior segment of the left lower lobe is a 4.3 x 3.7 x 4.0 cm thick-walled cavitary mass, highly suspicious for a cavitary neoplasm. Adjacent to this within the medial aspect of the left lower  lobe there is extensive airspace consolidation and septal thickening. Focal pleuroparenchymal architectural distortion in the apex of the right upper lobe is strongly favored to be chronic post infectious or inflammatory scarring. No other suspicious appearing pulmonary nodules or masses are noted at this time. There is a background of moderate centrilobular emphysema with mild diffuse bronchial wall thickening. No pleural effusions. Small amount of scarring in the inferior segment of the lingula.  Musculoskeletal: There are no aggressive appearing lytic or blastic lesions noted in the visualized portions of the skeleton.  CT ABDOMEN AND PELVIS FINDINGS  Hepatobiliary: No focal cystic or solid hepatic lesions. No intra or extrahepatic biliary ductal dilatation. Gallbladder is normal in appearance.  Pancreas: Unremarkable.  Spleen: Unremarkable.  Adrenals/Urinary Tract: Extensive atrophy in the lower pole of the right kidney, presumably from prior infection/ inflammation. Sub cm low-attenuation lesions in the kidneys bilaterally too small to characterize, but favored to represent small cysts. In addition, there is a 1.6 cm simple cyst in the interpolar region of the left kidney. No hydroureteronephrosis. Urinary bladder is normal in appearance.  Stomach/Bowel: Normal appearance of the stomach. No pathologic dilatation of small bowel or colon. Normal appendix (retrocecal).  Vascular/Lymphatic: Moderate to severe stenoses at the origins of the celiac axis and superior mesenteric arteries. Complete occlusion of the abdominal aorta immediately beneath the origin of the renal arteries. This occlusion extends into the pelvis where the common iliac arteries, internal iliac arteries and external iliac arteries are all completely occluded bilaterally. There is reconstitution of flow in the common femoral arteries bilaterally, and there are multiple collateral arteries noted in the pelvic sidewall bilaterally. IMA origin is  also chronically occluded, although the distal aspect of the vessel is patent secondary to a collateral pathway which appears to involve a branch from the proximal superior mesenteric artery. No lymphadenopathy noted in the abdomen or pelvis.  Reproductive: Prostate gland and seminal vesicles are unremarkable in appearance.  Other: No significant volume of ascites.  No pneumoperitoneum.  Musculoskeletal: There are no aggressive appearing lytic or blastic lesions noted in the visualized portions of the skeleton.  IMPRESSION: 1. 4.3 x 3.7 x 4.3 cm thick-walled cavitary mass in the superior segment of the left lower lobe with associated left hilar adenopathy, and previously diagnosed brain metastases. Findings are compatible with stage IV lung cancer (T2A, N1, M1b). 2. Extensive airspace consolidation in the medial  aspect of the left lower lobe. Although this could in part related to postobstructive changes, the position of lesion argues against this. This may simply represent endobronchial spread of hemorrhage or secretions from the lesion, as lesion does appear to communicate with the airways. Alternatively, this could represent some lymphangitic spread of tumor, however, that is not strongly favored at this time. 3. 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 In addition, there is left main and 2 vessel coronary artery disease. 4. Additional incidental findings, as above.   Electronically Signed   By: Vinnie Langton M.D.   On: 07/03/2014 22:00   Mr Jeri Cos Contrast  07/04/2014    CLINICAL DATA:  Abnormal head CT with brain mass. Lung mass on chest CT. Rule out metastatic disease versus abscess.  EXAM: MRI HEAD WITHOUT AND WITH CONTRAST  TECHNIQUE: Multiplanar, multiecho pulse sequences of the brain and surrounding structures were obtained without and with intravenous contrast.  CONTRAST:  72mL MULTIHANCE GADOBENATE DIMEGLUMINE 529 MG/ML IV SOLN  COMPARISON:  CT head 07/03/2014  FINDINGS: Extensive cerebral edema is present in the right frontal and parietal lobes related to ring-enhancing mass lesions. There is mass-effect and midline shift measuring 13 mm to the left.  Right frontal lobe ring-enhancing mass lesion measures 3 x 4 cm. There is a focal thickened area of enhancement anterior to the cavitary mass. There is internal hemorrhage within the mass and a large amount of surrounding edema. The wall of the mass is a relatively thin and enhances homogeneously  Right parietal ring-enhancing mass lesion measures 2.5 x 3.0 cm and has a large amount of surrounding edema with a mild amount of internal hemorrhage. The wall of this mass is thin and relatively homogeneous in thickness.  Small enhancing lesion in the right occipital lobe measures 8 mm in also has a small amount of hemorrhage and surrounding edema.  No enhancing lesions are seen in the left hemisphere.  Diffusion-weighted imaging reveals restricted diffusion within the 3 lesions on the right. No acute infarct is identified.  Ventricle size is normal.  Brainstem and cerebellum normal.  IMPRESSION: Three ring-enhancing mass lesions in the right hemisphere involving the right frontal lobe, right parietal lobe, and right occipital lobe. These lesions show restricted diffusion and internal hemorrhage and contain a large amount of surrounding vasogenic edema. 13 mm midline shift to the left.  Differential diagnosis for these lesions includes metastatic disease and brain abscess. Currently the patient is not have an elevated white count or  fever. There is a left lower lobe lung mass with left hilar adenopathy. The findings are most likely related to carcinoma lung with metastatic disease to the brain.   Electronically Signed   By: Franchot Gallo M.D.   On: 07/04/2014 15:08   Ct Abdomen Pelvis W Contrast  07/03/2014   CLINICAL DATA:  68 year old male with lung lesion and brain mass. Evaluate for underlying malignancy.  EXAM: CT CHEST, ABDOMEN, AND PELVIS WITH CONTRAST  TECHNIQUE: Multidetector CT imaging of the chest, abdomen and pelvis was performed following the standard protocol during bolus administration of intravenous contrast.  CONTRAST:  142mL OMNIPAQUE IOHEXOL 300  MG/ML  SOLN  COMPARISON:  No priors.  FINDINGS: CT CHEST FINDINGS  Mediastinum: Heart size is normal. There is no significant pericardial fluid, thickening or pericardial calcification. There is atherosclerosis of the thoracic aorta, the great vessels of the mediastinum and the coronary arteries, including calcified atherosclerotic plaque in the left main, left anterior descending and right coronary arteries. Left hilar lymphadenopathy measuring up to 19 x 27 mm (image 26 of series 201). No other mediastinal or right hilar adenopathy is noted. Esophagus is unremarkable in appearance. Separate origin of the left vertebral artery directly off the aortic arch (normal anatomical variant) incidentally noted.  Lungs/Pleura: Centered in the superior segment of the left lower lobe is a 4.3 x 3.7 x 4.0 cm thick-walled cavitary mass, highly suspicious for a cavitary neoplasm. Adjacent to this within the medial aspect of the left lower lobe there is extensive airspace consolidation and septal thickening. Focal pleuroparenchymal architectural distortion in the apex of the right upper lobe is strongly favored to be chronic post infectious or inflammatory scarring. No other suspicious appearing pulmonary nodules or masses are noted at this time. There is a background of moderate centrilobular  emphysema with mild diffuse bronchial wall thickening. No pleural effusions. Small amount of scarring in the inferior segment of the lingula.  Musculoskeletal: There are no aggressive appearing lytic or blastic lesions noted in the visualized portions of the skeleton.  CT ABDOMEN AND PELVIS FINDINGS  Hepatobiliary: No focal cystic or solid hepatic lesions. No intra or extrahepatic biliary ductal dilatation. Gallbladder is normal in appearance.  Pancreas: Unremarkable.  Spleen: Unremarkable.  Adrenals/Urinary Tract: Extensive atrophy in the lower pole of the right kidney, presumably from prior infection/ inflammation. Sub cm low-attenuation lesions in the kidneys bilaterally too small to characterize, but favored to represent small cysts. In addition, there is a 1.6 cm simple cyst in the interpolar region of the left kidney. No hydroureteronephrosis. Urinary bladder is normal in appearance.  Stomach/Bowel: Normal appearance of the stomach. No pathologic dilatation of small bowel or colon. Normal appendix (retrocecal).  Vascular/Lymphatic: Moderate to severe stenoses at the origins of the celiac axis and superior mesenteric arteries. Complete occlusion of the abdominal aorta immediately beneath the origin of the renal arteries. This occlusion extends into the pelvis where the common iliac arteries, internal iliac arteries and external iliac arteries are all completely occluded bilaterally. There is reconstitution of flow in the common femoral arteries bilaterally, and there are multiple collateral arteries noted in the pelvic sidewall bilaterally. IMA origin is also chronically occluded, although the distal aspect of the vessel is patent secondary to a collateral pathway which appears to involve a branch from the proximal superior mesenteric artery. No lymphadenopathy noted in the abdomen or pelvis.  Reproductive: Prostate gland and seminal vesicles are unremarkable in appearance.  Other: No significant volume of  ascites.  No pneumoperitoneum.  Musculoskeletal: There are no aggressive appearing lytic or blastic lesions noted in the visualized portions of the skeleton.  IMPRESSION: 1. 4.3 x 3.7 x 4.3 cm thick-walled cavitary mass in the superior segment of the left lower lobe with associated left hilar adenopathy, and previously diagnosed brain metastases. Findings are compatible with stage IV lung cancer (T2A, N1, M1b). 2. Extensive airspace consolidation in the medial aspect of the left lower lobe. Although this could in part related to postobstructive changes, the position of lesion argues against this. This may simply represent endobronchial spread of hemorrhage or secretions from the lesion, as lesion does appear to communicate  with the airways. Alternatively, this could represent some lymphangitic spread of tumor, however, that is not strongly favored at this time. 3. 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 In addition, there is left main and 2 vessel coronary artery disease. 4. Additional incidental findings, as above.   Electronically Signed   By: Vinnie Langton M.D.   On: 07/03/2014 22:00   Ct Biopsy  07/07/2014   CLINICAL DATA:  Liver lower lobe lung mass  EXAM: CT-GUIDED BIOPSY OF A LEFT LOWER LOBE LUNG MASS.  CORE.  MEDICATIONS AND MEDICAL HISTORY: Versed 1 mg, Fentanyl 50 mcg.  Additional Medications: None.  ANESTHESIA/SEDATION: Moderate sedation time: 5 minutes  PROCEDURE: The procedure, risks, benefits, and alternatives were explained to the patient. Questions regarding the  procedure were encouraged and answered. The patient understands and consents to the procedure.  The posterior thorax was prepped with Betadine in a sterile fashion, and a sterile drape was applied covering the operative field. A sterile gown and sterile gloves were used for the procedure.  Under CT guidance, a(n) 17 gauge guide needle was advanced into the cavitary left lower lobe lung mass. Subsequently 3 18 gauge core biopsies were obtained. The guide needle was removed. Final imaging was performed.  Patient tolerated the procedure well without complication. Vital sign monitoring by nursing staff during the procedure will continue as patient is in the special procedures unit for post procedure observation.  FINDINGS: The images document guide needle placement within the left lower lobe lung mass. Post biopsy images demonstrate no pneumothorax.  COMPLICATIONS: None  IMPRESSION: Successful CT-guided core biopsy of a left lower lobe lung mass.   Electronically Signed   By: Maryclare Bean M.D.   On: 07/07/2014 15:05    Admission HPI:  Anthony Lopez is a 68 yo right-handed man with a history of Mohs surgery on his right face in 2007 (basal cell carcinoma per pt), CVA in 2000, hypertension and 125 pack year history of smoking who presented to the ED today for hemoptysis, headache, confusion, unstable gait and difficulty getting dressed. Over the past two weeks, he has noticed blood when he coughs. Then, about a week ago, he started to have a right frontal headache just above his right eye that would come and go. The pain was pounding, did not move and reached a 10/10 in intensity at times. He initially visited his PCP for his symptoms last Friday. During this appointment, the patient was started on pain medication. Over the weekend, his symptoms all progressed, and his wife took him off of the medication. He returned to his PCP on Monday and a mass was visualized on chest XR. He was started on doxycycline and provided  with a plan for investigation of the mass. However, his symptoms persisted; his headache was present for most of the day, he seemed confused and slightly slower than usual to his family, and he had difficulty taking off and putting on his clothes and navigating in familiar places. He would also start to fall to the side when standing and needed help to move. In fact, he had one fall during which he hit  the back of his head when he misjudged the position of a chair and went to sit in it. He is unsure whether he has had fever or chills. He has had no other falls, dizziness, nausea, vomiting, chest pain or shortness of breath.  In the ED, CT head revealed metastatic lesions in the right frontal lobe and right occipital lobe with extensive vasogenic edema.  Hospital Course by problem list: Principal Problem:   Lung cancer Active Problems:   Tobacco abuse   HTN (hypertension)   Brain edema   Brain metastasis   Abnormality of gait   Headache   Lesion of lung   68 yo male with hx of basal cell carcinoma, 125pack year smoking hx here with ataxia, headache, hemoptysis found to have cavitary lung lesion and likely mets to the brain.  Suspected Primary Lung Cancer with Brain mets - Lung cavitary mass in LLL on CT and with possible brain mets right frontal lobe and right occipital lobe with extensive vasogenic edema and midline shift leftward seen on head CT. This was better characterized on brain MRI showing 3 ring-enhacing mass in right frontal, right parietal, and right occpital lobes, with itnernal hemorrhage and large vasogenic edema, and midline shift to left.  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 Also could be adenocarcinoma given peripheral location on lung.  - neurosurgery and radiation onc consulted inpatient and seen patient here. We followed their recs. Has follow up appts outpatient. Did CT guided needle biopsy of the  left lung mass. Pathology results pending. If it's small cell, then would be treated with chmo alone vs other types would be treated with stereotactic radiation surgery followed by craniotomy per neurosurgery. - Dr. Beryle Beams was attending (also hemoonc) and he asked Dr. Julien Nordmann med onc to see patient as outpatient.  - continued decadron 4mg  IV q6hr for vasogenic edema here. Had hiccups likely 2/2 to decadron so continue baclofen to help with hiccups. Discharging with 4mg  PO q6hr decadron at home and also baclofen.  - outpatient follow up strongly emphaized for the workup and treatment as biopsy results come back.  HTN - under goal <150 - Continue lisinopril 10mg  daily  Discharge Vitals:   BP 143/73 mmHg  Pulse 60  Temp(Src) 97.6 F (36.4 C) (Oral)  Resp 17  Ht 5\' 10"  (1.778 m)  Wt 61.5 kg (135 lb 9.3 oz)  BMI 19.45 kg/m2  SpO2 97%  Discharge Labs:  Results for orders placed or performed during the hospital encounter of 07/03/14 (from the past 24 hour(s))  CBC     Status: Abnormal   Collection Time: 07/07/14  2:36 AM  Result Value Ref Range   WBC 12.0 (H) 4.0 - 10.5 K/uL   RBC 3.91 (L) 4.22 - 5.81 MIL/uL   Hemoglobin 11.8 (L) 13.0 - 17.0 g/dL   HCT 34.3 (L) 39.0 - 52.0 %   MCV 87.7 78.0 - 100.0 fL   MCH 30.2 26.0 - 34.0 pg   MCHC 34.4 30.0 - 36.0 g/dL   RDW 12.3 11.5 - 15.5 %   Platelets 195 150 - 400 K/uL  Basic metabolic panel     Status: Abnormal   Collection Time: 07/07/14  2:36 AM  Result Value Ref Range   Sodium 135 (L) 137 - 147 mEq/L   Potassium 4.5 3.7 - 5.3 mEq/L   Chloride 99 96 - 112 mEq/L   CO2 25 19 - 32 mEq/L  Glucose, Bld 179 (H) 70 - 99 mg/dL   BUN 30 (H) 6 - 23 mg/dL   Creatinine, Ser 0.80 0.50 - 1.35 mg/dL   Calcium 8.7 8.4 - 10.5 mg/dL   GFR calc non Af Amer >90 >90 mL/min   GFR calc Af Amer >90 >90 mL/min   Anion gap 11 5 - 15    Signed: Dellia Nims, MD 07/07/2014, 4:20 PM    Services Ordered on Discharge:  Equipment Ordered on  Discharge:

## 2014-07-07 NOTE — Evaluation (Signed)
Physical Therapy Evaluation Patient Details Name: Anthony Lopez MRN: [id number] DOB: 01-24-1946 Today's Date: 07/07/2014   History of Present Illness  Pt  presents with a three-week history of intermittent low grade hemoptysis, headache, mild intermittent confusion, and ataxia. 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 Pt underwent lung biopsy earlier today.  Clinical Impression  Pt admitted with above. Pt currently with functional limitations due to the deficits listed below (see PT Problem List).  Pt will benefit from skilled PT to increase their independence and safety with mobility to allow discharge home with wife and daughters. At this time pt able to return home with 24 hour support of family and HHPT.    Follow Up Recommendations Home health PT;Supervision/Assistance - 24 hour    Equipment Recommendations  Rolling walker with 5" wheels    Recommendations for Other Services       Precautions / Restrictions Precautions Precautions: Fall      Mobility  Bed Mobility Overal bed mobility: Modified Independent                Transfers Overall transfer level: Needs assistance Equipment used: Rolling walker (2 wheeled);None Transfers: Sit to/from Stand Sit to Stand: Min assist         General transfer comment: Assist for balance  Ambulation/Gait Ambulation/Gait assistance: Min guard;Min assist Ambulation Distance (Feet): 200 Feet Assistive device: Rolling walker (2 wheeled);None Gait Pattern/deviations: Step-through pattern;Drifts right/left   Gait velocity interpretation: Below normal speed for age/gender General Gait Details: Pt with rt gaze preference and decr attention to lt. Unsteady but improved with use of walker. Pt/wife verbalized understanding of need for assist with mobility at home.  Stairs            Wheelchair Mobility    Modified Rankin (Stroke Patients Only)       Balance Overall balance assessment: Needs  assistance Sitting-balance support: No upper extremity supported Sitting balance-Leahy Scale: Fair     Standing balance support: Single extremity supported Standing balance-Leahy Scale: Poor                               Pertinent Vitals/Pain      Home Living Family/patient expects to be discharged to:: Private residence Living Arrangements: Spouse/significant other Available Help at Discharge: Family;Available 24 hours/day Type of Home: House Home Access: Stairs to enter Entrance Stairs-Rails: Right Entrance Stairs-Number of Steps: 3 Home Layout: One level Home Equipment: None      Prior Function Level of Independence: Independent         Comments: Recent history of falling into walls.     Hand Dominance   Dominant Hand: Right    Extremity/Trunk Assessment   Upper Extremity Assessment: Overall WFL for tasks assessed           Lower Extremity Assessment: Overall WFL for tasks assessed         Communication   Communication: No difficulties  Cognition Arousal/Alertness: Awake/alert Behavior During Therapy: WFL for tasks assessed/performed Overall Cognitive Status: Impaired/Different from baseline Area of Impairment: Safety/judgement         Safety/Judgement: Decreased awareness of safety;Decreased awareness of deficits          General Comments      Exercises        Assessment/Plan    PT Assessment Patient needs continued PT services  PT Diagnosis Difficulty walking;Abnormality of gait   PT Problem List Decreased  activity tolerance;Decreased balance;Decreased mobility;Decreased cognition;Decreased knowledge of use of DME;Decreased safety awareness;Decreased knowledge of precautions  PT Treatment Interventions DME instruction;Gait training;Functional mobility training;Therapeutic activities;Therapeutic exercise;Balance training;Patient/family education;Stair training   PT Goals (Current goals can be found in the Care Plan  section) Acute Rehab PT Goals Patient Stated Goal: go home PT Goal Formulation: With patient/family Time For Goal Achievement: 07/14/14 Potential to Achieve Goals: Good    Frequency Min 3X/week   Barriers to discharge        Co-evaluation               End of Session Equipment Utilized During Treatment: Gait belt Activity Tolerance: Patient tolerated treatment well Patient left: in chair;with call bell/phone within reach;with family/visitor present Nurse Communication: Mobility status         Time: 1761-6073 PT Time Calculation (min) (ACUTE ONLY): 18 min   Charges:   PT Evaluation $Initial PT Evaluation Tier I: 1 Procedure PT Treatments $Gait Training: 8-22 mins   PT G Codes:          Coty Student 2014-07-14, 3:40 PM  [facility] PT [phone]

## 2014-07-07 NOTE — Discharge Instructions (Addendum)
You were admitted with headache, balance problem, and coughing out blood. As we discussed, it is likely due to lung cancer spreading to your brain. You must follow closely with the outpatient appointments with radiation oncology, medical oncology, and also neurosurgery. I tried to make the appointments for you but was not able to. Please make sure you follow up with them. By the time you see them, hopefully the biospy results from your lung mass biopsy would be back.

## 2014-07-07 NOTE — Procedures (Signed)
LLL lung mass Bx No comp

## 2014-07-07 NOTE — Progress Notes (Signed)
We'll await pathology.  If it is small cell then I would recommend radiation therapy.  If it is large cell and I'll recommend preoperative stereotactic radiosurgery followed by resection of the right frontal lesion.  I spoke with the family at length today.

## 2014-07-07 NOTE — Progress Notes (Signed)
Discharge instructions reviewed with patient and wife both verbalized understanding . D/c via wheelchair

## 2014-07-08 ENCOUNTER — Other Ambulatory Visit: Payer: Self-pay | Admitting: Radiation Therapy

## 2014-07-08 DIAGNOSIS — C7949 Secondary malignant neoplasm of other parts of nervous system: Principal | ICD-10-CM

## 2014-07-08 DIAGNOSIS — C7931 Secondary malignant neoplasm of brain: Secondary | ICD-10-CM

## 2014-07-08 NOTE — Care Management Note (Signed)
    Page 1 of 1   07/08/2014     8:55:25 AM CARE MANAGEMENT NOTE 07/08/2014  Patient:  Anthony Lopez, Anthony Lopez   Account Number:  [id number]  Date Initiated:  07/07/2014  Documentation initiated by:  Sritha Chauncey  Subjective/Objective Assessment:   dx lung CA with brain mets; lives with spouse     Anticipated DC Date:  07/07/2014   Anticipated DC Plan:  [place]  CM consult      [place]   Choice offered to / List presented to:  C-1 Patient   DME arranged  Vassie Moselle      DME agency  [place] arranged  [place]   Status of service:  Completed, signed off Medicare Important Message given?  YES (If response is "NO", the following Medicare IM given date fields will be blank) Date Medicare IM given:  07/08/2014 Medicare IM given by:  Gresham Caetano Date Additional Medicare IM given:   Additional Medicare IM given by:    Discharge Disposition:  [place]  Per UR Regulation:  Reviewed for med. necessity/level of care/duration of stay  Comments:  07/07/14 [place] MSN BSN CCM PT recommends home therapy and rolling walker.  Discussed with pt and wife @ bedside, provided list of home health agencies.  Referral called to [facility] liaison.

## 2014-07-09 ENCOUNTER — Encounter: Payer: Self-pay | Admitting: *Deleted

## 2014-07-09 ENCOUNTER — Telehealth: Payer: Self-pay | Admitting: *Deleted

## 2014-07-09 ENCOUNTER — Other Ambulatory Visit: Payer: Self-pay | Admitting: Neurological Surgery

## 2014-07-09 NOTE — Telephone Encounter (Signed)
Called left vm message to call with appt.  I left my name and phone number

## 2014-07-09 NOTE — [facility] Oncology Navigator Note (Unsigned)
Patient's wife called back.  I clarified appt with her. She asked about husband's pathology.  I stated I could not give that to her. I asked that she call his PCP for assistance.  She stated there are so many doctors she can't keep it straight.  I stated Dr. Julien Nordmann will give her all information on Friday.  She was still very frustrated with that.  I gave her phone number for Hospitalist team she stated they saw in hospital.  She stated she would try to get in touch with someone.  I apologized for not being abel to give her the information she needed.

## 2014-07-09 NOTE — Telephone Encounter (Signed)
Patient's wife called regarding message I had left earlier.  They are having some phone troubles.  I called back and left appt date and time.  I also left my name and phone number to call.

## 2014-07-11 ENCOUNTER — Ambulatory Visit (HOSPITAL_BASED_OUTPATIENT_CLINIC_OR_DEPARTMENT_OTHER): Payer: Medicare HMO | Admitting: Internal Medicine

## 2014-07-11 ENCOUNTER — Encounter: Payer: Self-pay | Admitting: Internal Medicine

## 2014-07-11 ENCOUNTER — Other Ambulatory Visit: Payer: Medicare HMO

## 2014-07-11 ENCOUNTER — Telehealth: Payer: Self-pay | Admitting: Internal Medicine

## 2014-07-11 VITALS — BP 142/67 | HR 63 | Temp 97.8°F | Resp 18 | Ht 70.0 in | Wt 133.9 lb

## 2014-07-11 DIAGNOSIS — C3432 Malignant neoplasm of lower lobe, left bronchus or lung: Secondary | ICD-10-CM

## 2014-07-11 DIAGNOSIS — C7931 Secondary malignant neoplasm of brain: Secondary | ICD-10-CM

## 2014-07-11 NOTE — Telephone Encounter (Signed)
Lm to confirm appt  for  Dec. Mailed avs & cal due to system down.

## 2014-07-11 NOTE — Progress Notes (Signed)
[place] [phone]   [phone]  CONSULT NOTE  REFERRING PHYSICIAN: Dr. Murriel Hopper  REASON FOR CONSULTATION:  68 years old white male recently diagnosed with metastatic lung cancer.  HPI Anthony Lopez is a 68 y.o. male with long history of smoking and past medical history significant for hypertension and stroke in 2001 as well as history of skin cancer status post resection. The patient mentions that for the last few weeks he has been complaining of headache and cough with low-grade hemoptysis in addition to confusion and ataxia. His symptoms were very similar to his stroke symptoms in 2001. He presented to the emergency Department at [facility] and CT scan of the head followed by MRI of the brain on 07/04/2014 were performed. The MRI of the brain showed 3 ring-enhancing mass lesions in the right hemisphere involving the right frontal lobe, right parietal lobe and right occipital lobe. These lesions showed restricted diffusion and internal hemorrhage and contain a large amount of surrounding vasogenic edema with 13 mm midline shift to the left. These were highly suspicious for metastatic disease to the brain. CT scan of the chest, abdomen and pelvis were performed on 07/03/2014 and it showed 4.3 x 3.7 x 4.3 cm thick-walled cavitating mass in the superior segment of the left lower lobe with associated left hilar adenopathy. These findings were compatible with stage IV lung cancer taken into consideration the brain metastasis. There was also extensive air space consolidation in the medial aspect of the left lower lobe that could be in part related to postobstructive changes or simply endobronchially spread of hemorrhage or secretions from the lesion.  On 07/07/2014 the patient underwent CT-guided core biopsy of the left lower lobe lung mass by interventional radiology.  The final pathology (Accession: [id number]) was consistent with poorly  differentiated carcinoma.The carcinoma demonstrates the following immunophenotype:TTF-1 - diffuse moderate strong expression. Cytokeratin 7 - patchy moderate strong expression.Overall the morphology and immunophenotype are that of poorly differentiated carcinoma with glandular and squamous differentiation. The patient was seen by Dr. Tammi Klippel for evaluation of his metastatic brain lesion and scheduled to have repeat MRI of the brain with SRS protocol. He was also seen by Dr. Sherley Bounds from neurosurgery. He has been considered for resection of the right frontal lesion in addition to a stereotactic radiotherapy. This is expected to be performed in next week. Dr. Beryle Beams kindly referred the patient to me today for evaluation and discussion of her systemic treatment options. When seen today the patient is feeling fine except for the cough and with occasional hemoptysis but denied having any significant chest pain or shortness of breath. He continues to have headache which significantly improved after the patient was started on Decadron 4 mg by mouth 3 times a day. He also lost few pounds recently but he has good appetite. Family history significant for mother with diabetes mellitus and skin cancer and father with diabetes mellitus. The patient is married and has 2 daughters. He was accompanied today by his wife Anthony Lopez. He works for the [employer]. He has a history of smoking up to 3 packs per day for around 50 years and unfortunately continues to smoke but less than 1 pack per day. I strongly encouraged the patient to quit smoking and offered him to smoke cessation program. He has no history of alcohol or drug abuse. HPI  Past Medical History  Diagnosis Date  . CVA (cerebral infarction) 2000  . HTN (hypertension),  benign   . Rheumatic fever     Age 18 or 57  . Tobacco abuse     Past Surgical History  Procedure Laterality Date  . Transthoracic echocardiogram  January 2004    Normal LV  size and function. EF 55-65%.  . Carotid dopplers Bilateral 07/18/2013    Right subclavian 0-49%, left subclavian mild bilateral carotid stenosis..  . 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   . Mohs surgery      h/o NMSC (scc or bcc)    Family History  Problem Relation Age of Onset  . Diabetes Mother   . Diabetes Father   . Heart attack Father   . Skin cancer Mother   . Hypertension Daughter     Social History History  Substance Use Topics  . Smoking status: Current Every Day Smoker -- 1.00 packs/day for 50 years    Types: Cigarettes  . Smokeless tobacco: Not on file     Comment: Smokes < a pack per day now.  . Alcohol Use: No    Allergies  Allergen Reactions  . Doxycycline     Stay asleep   . Penicillins Other (See Comments)    unspecified    Current Outpatient Prescriptions  Medication Sig Dispense Refill  . dexamethasone (DECADRON) 4 MG tablet Take 1 tablet (4 mg total) by mouth 3 (three) times daily. 90 tablet 0  . lisinopril (PRINIVIL,ZESTRIL) 10 MG tablet Take 10 mg by mouth daily.    . Omega-3 Fatty Acids (FISH OIL) 1000 MG CAPS Take by mouth.     No current facility-administered medications for this visit.    Review of Systems  Constitutional: positive for fatigue and weight loss Eyes: negative Ears, nose, mouth, throat, and face: negative Respiratory: positive for cough, hemoptysis and sputum Cardiovascular: negative Gastrointestinal: negative Genitourinary:negative Integument/breast: negative Hematologic/lymphatic: negative Musculoskeletal:negative Neurological: positive for headaches Behavioral/Psych: negative Endocrine: negative Allergic/Immunologic: negative  Physical Exam  MPN:TIRWE, healthy, no distress, well nourished and well developed SKIN: skin color, texture, turgor are normal, no rashes or significant lesions HEAD:  Normocephalic, No masses, lesions, tenderness or abnormalities EYES: normal, PERRLA EARS: External ears normal, Canals clear OROPHARYNX:no exudate, no erythema and lips, buccal mucosa, and tongue normal  NECK: supple, no adenopathy, no JVD LYMPH:  no palpable lymphadenopathy, no hepatosplenomegaly LUNGS: clear to auscultation , and palpation HEART: regular rate & rhythm, no murmurs and no gallops ABDOMEN:abdomen soft, non-tender, normal bowel sounds and no masses or organomegaly BACK: Back symmetric, no curvature., No CVA tenderness EXTREMITIES:no joint deformities, effusion, or inflammation, no edema, no skin discoloration  NEURO: alert & oriented x 3 with fluent speech, no focal motor/sensory deficits  PERFORMANCE STATUS: ECOG 1  LABORATORY DATA: Lab Results  Component Value Date   WBC 12.0* 07/07/2014   HGB 11.8* 07/07/2014   HCT 34.3* 07/07/2014   MCV 87.7 07/07/2014   PLT 195 07/07/2014      Chemistry      Component Value Date/Time   NA 135* 07/07/2014 0236   K 4.5 07/07/2014 0236   CL 99 07/07/2014 0236   CO2 25 07/07/2014 0236   BUN 30* 07/07/2014 0236   CREATININE 0.80 07/07/2014 0236      Component Value Date/Time   CALCIUM 8.7 07/07/2014 0236   ALKPHOS 57 07/03/2014 1815   AST 11 07/03/2014 1815   ALT 9 07/03/2014 1815   BILITOT 0.2*  07/03/2014 1815       RADIOGRAPHIC STUDIES: Dg Chest 1 View  07/07/2014   CLINICAL DATA:  Status post left lung biopsy  EXAM: CHEST - 1 VIEW  COMPARISON:  07/07/2014  FINDINGS: Status post left lung biopsy. Left lower lobe reticular nodular interstitial disease with a cavitary mass in the superior segment of the left lower lobe. The right lung is clear. There is no pneumothorax. Stable cardiomediastinal silhouette. Unremarkable osseous structures.  IMPRESSION: 1. No left pneumothorax status post left lung biopsy.   Electronically Signed   By: Kathreen Devoid   On: 07/07/2014 15:53   Dg Chest 2 View  07/03/2014   CLINICAL DATA:   Chest pain, brain metastasis on CT same day  EXAM: CHEST  2 VIEW  COMPARISON:  Chest radiograph 06/30/2014  FINDINGS: Normal cardiac silhouette. The left mid lung lesion measures 14 mm decreased from 22 mm on prior. There is airspace disease in lower lobe seen on lateral projection. No aggressive osseous lesion.  IMPRESSION: 1. The left lung lesion is smaller than comparison exam but still concerning for lung cancer given the brain lesions. Recommend CT thorax with contrast for further evaluation.  2.  Potential left lower lobe pneumonia.   Electronically Signed   By: Suzy Bouchard M.D.   On: 07/03/2014 18:20   Ct Head Wo Contrast  07/03/2014   ADDENDUM REPORT: 07/03/2014 18:25  ADDENDUM: A chest x-ray was performed following the head CT. The left lung lesion is decreased in size over 3 day interval. This raises the concern that the pulmonary lesions could be cerebral abscesses. Metastatic disease remains in differential. Recommend MRI of the brain with and without contrast to distinguish between infection and neoplasm.  Additional findings and recommendations conveyed toJASON MESNER on 07/03/2014 at18:24.   Electronically Signed   By: Suzy Bouchard M.D.   On: 07/03/2014 18:25   07/03/2014   CLINICAL DATA:  Gait abnormality  EXAM: CT HEAD WITHOUT CONTRAST  TECHNIQUE: Contiguous axial images were obtained from the base of the skull through the vertex without intravenous contrast.  COMPARISON:  Head CT 10/19/2005 on chest radiograph 07/30/2014  FINDINGS: There is round lesion in right frontal lobe measuring 30 mm which has centrally low-attenuation. There is a large field of a the vasogenic edema surrounding this intraparenchymal lesion. There is a subfalcine herniation in the right frontal lobe with 7 cm of leftward midline shift. There is a second lesion in the right occipital lobe measuring 2.3 cm also with a large field of vasogenic edema.  There is compression of the lateral ventricles on the right.  The quadrigeminal plate cistern is effaced on the left. There is 1e mm of midline shift centrally at the level of the ventricles. No intraparenchymal hemorrhage.  IMPRESSION: 1. Metastatic lesions in the right frontal lobe and right occipital lobe with extensive vasogenic edema. 2. Moderate leftward midline shift. 3. Effacement of the quadrigeminal plate cistern on the left. Subfalcine herniation anteriorly. 4. Patient has a lesion in the left lung on chest x-ray therefore this likely represents metastatic lung cancer. Critical Value/emergent results were called by telephone at the time of interpretation on 07/03/2014 at 6:13 pm to Dr. Merrily Pew , who verbally acknowledged these results.  Electronically Signed: By: Suzy Bouchard M.D. On: 07/03/2014 18:14   Ct Chest W Contrast  07/03/2014   CLINICAL DATA:  68 year old male with lung lesion and brain mass. Evaluate for underlying malignancy.  EXAM: CT CHEST, ABDOMEN, AND PELVIS WITH  CONTRAST  TECHNIQUE: Multidetector CT imaging of the chest, abdomen and pelvis was performed following the standard protocol during bolus administration of intravenous contrast.  CONTRAST:  197mL OMNIPAQUE IOHEXOL 300 MG/ML  SOLN  COMPARISON:  No priors.  FINDINGS: CT CHEST FINDINGS  Mediastinum: Heart size is normal. There is no significant pericardial fluid, thickening or pericardial calcification. There is atherosclerosis of the thoracic aorta, the great vessels of the mediastinum and the coronary arteries, including calcified atherosclerotic plaque in the left main, left anterior descending and right coronary arteries. Left hilar lymphadenopathy measuring up to 19 x 27 mm (image 26 of series 201). No other mediastinal or right hilar adenopathy is noted. Esophagus is unremarkable in appearance. Separate origin of the left vertebral artery directly off the aortic arch (normal anatomical variant) incidentally noted.  Lungs/Pleura: Centered in the superior segment of the left  lower lobe is a 4.3 x 3.7 x 4.0 cm thick-walled cavitary mass, highly suspicious for a cavitary neoplasm. Adjacent to this within the medial aspect of the left lower lobe there is extensive airspace consolidation and septal thickening. Focal pleuroparenchymal architectural distortion in the apex of the right upper lobe is strongly favored to be chronic post infectious or inflammatory scarring. No other suspicious appearing pulmonary nodules or masses are noted at this time. There is a background of moderate centrilobular emphysema with mild diffuse bronchial wall thickening. No pleural effusions. Small amount of scarring in the inferior segment of the lingula.  Musculoskeletal: There are no aggressive appearing lytic or blastic lesions noted in the visualized portions of the skeleton.  CT ABDOMEN AND PELVIS FINDINGS  Hepatobiliary: No focal cystic or solid hepatic lesions. No intra or extrahepatic biliary ductal dilatation. Gallbladder is normal in appearance.  Pancreas: Unremarkable.  Spleen: Unremarkable.  Adrenals/Urinary Tract: Extensive atrophy in the lower pole of the right kidney, presumably from prior infection/ inflammation. Sub cm low-attenuation lesions in the kidneys bilaterally too small to characterize, but favored to represent small cysts. In addition, there is a 1.6 cm simple cyst in the interpolar region of the left kidney. No hydroureteronephrosis. Urinary bladder is normal in appearance.  Stomach/Bowel: Normal appearance of the stomach. No pathologic dilatation of small bowel or colon. Normal appendix (retrocecal).  Vascular/Lymphatic: Moderate to severe stenoses at the origins of the celiac axis and superior mesenteric arteries. Complete occlusion of the abdominal aorta immediately beneath the origin of the renal arteries. This occlusion extends into the pelvis where the common iliac arteries, internal iliac arteries and external iliac arteries are all completely occluded bilaterally. There is  reconstitution of flow in the common femoral arteries bilaterally, and there are multiple collateral arteries noted in the pelvic sidewall bilaterally. IMA origin is also chronically occluded, although the distal aspect of the vessel is patent secondary to a collateral pathway which appears to involve a branch from the proximal superior mesenteric artery. No lymphadenopathy noted in the abdomen or pelvis.  Reproductive: Prostate gland and seminal vesicles are unremarkable in appearance.  Other: No significant volume of ascites.  No pneumoperitoneum.  Musculoskeletal: There are no aggressive appearing lytic or blastic lesions noted in the visualized portions of the skeleton.  IMPRESSION: 1. 4.3 x 3.7 x 4.3 cm thick-walled cavitary mass in the superior segment of the left lower lobe with associated left hilar adenopathy, and previously diagnosed brain metastases. Findings are compatible with stage IV lung cancer (T2A, N1, M1b). 2. Extensive airspace consolidation in the medial aspect of the left lower lobe. Although this could in  part related to postobstructive changes, the position of lesion argues against this. This may simply represent endobronchial spread of hemorrhage or secretions from the lesion, as lesion does appear to communicate with the airways. Alternatively, this could represent some lymphangitic spread of tumor, however, that is not strongly favored at this time. 3. 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 In addition, there is left main and 2 vessel coronary artery  disease. 4. Additional incidental findings, as above.   Electronically Signed   By: Vinnie Langton M.D.   On: 07/03/2014 22:00   Mr Jeri Cos Contrast  07/04/2014   CLINICAL DATA:  Abnormal head CT with brain mass. Lung mass on chest CT. Rule out metastatic disease versus abscess.  EXAM: MRI HEAD WITHOUT AND WITH CONTRAST  TECHNIQUE: Multiplanar, multiecho Lopez sequences of the brain and surrounding structures were obtained without and with intravenous contrast.  CONTRAST:  80mL MULTIHANCE GADOBENATE DIMEGLUMINE 529 MG/ML IV SOLN  COMPARISON:  CT head 07/03/2014  FINDINGS: Extensive cerebral edema is present in the right frontal and parietal lobes related to ring-enhancing mass lesions. There is mass-effect and midline shift measuring 13 mm to the left.  Right frontal lobe ring-enhancing mass lesion measures 3 x 4 cm. There is a focal thickened area of enhancement anterior to the cavitary mass. There is internal hemorrhage within the mass and a large amount of surrounding edema. The wall of the mass is a relatively thin and enhances homogeneously  Right parietal ring-enhancing mass lesion measures 2.5 x 3.0 cm and has a large amount of surrounding edema with a mild amount of internal hemorrhage. The wall of this mass is thin and relatively homogeneous in thickness.  Small enhancing lesion in the right occipital lobe measures 8 mm in also has a small amount of hemorrhage and surrounding edema.  No enhancing lesions are seen in the left hemisphere.  Diffusion-weighted imaging reveals restricted diffusion within the 3 lesions on the right. No acute infarct is identified.  Ventricle size is normal.  Brainstem and cerebellum normal.  IMPRESSION: Three ring-enhancing mass lesions in the right hemisphere involving the right frontal lobe, right parietal lobe, and right occipital lobe. These lesions show restricted diffusion and internal hemorrhage and contain a large amount of surrounding vasogenic edema. 13 mm midline  shift to the left.  Differential diagnosis for these lesions includes metastatic disease and brain abscess. Currently the patient is not have an elevated white count or fever. There is a left lower lobe lung mass with left hilar adenopathy. The findings are most likely related to carcinoma lung with metastatic disease to the brain.   Electronically Signed   By: Franchot Gallo M.D.   On: 07/04/2014 15:08   Ct Abdomen Pelvis W Contrast  07/03/2014   CLINICAL DATA:  68 year old male with lung lesion and brain mass. Evaluate for underlying malignancy.  EXAM: CT CHEST, ABDOMEN, AND PELVIS WITH CONTRAST  TECHNIQUE: Multidetector CT imaging of the chest, abdomen and pelvis was performed following the standard protocol during bolus administration of intravenous contrast.  CONTRAST:  166mL OMNIPAQUE IOHEXOL 300 MG/ML  SOLN  COMPARISON:  No priors.  FINDINGS:  CT CHEST FINDINGS  Mediastinum: Heart size is normal. There is no significant pericardial fluid, thickening or pericardial calcification. There is atherosclerosis of the thoracic aorta, the great vessels of the mediastinum and the coronary arteries, including calcified atherosclerotic plaque in the left main, left anterior descending and right coronary arteries. Left hilar lymphadenopathy measuring up to 19 x 27 mm (image 26 of series 201). No other mediastinal or right hilar adenopathy is noted. Esophagus is unremarkable in appearance. Separate origin of the left vertebral artery directly off the aortic arch (normal anatomical variant) incidentally noted.  Lungs/Pleura: Centered in the superior segment of the left lower lobe is a 4.3 x 3.7 x 4.0 cm thick-walled cavitary mass, highly suspicious for a cavitary neoplasm. Adjacent to this within the medial aspect of the left lower lobe there is extensive airspace consolidation and septal thickening. Focal pleuroparenchymal architectural distortion in the apex of the right upper lobe is strongly favored to be chronic  post infectious or inflammatory scarring. No other suspicious appearing pulmonary nodules or masses are noted at this time. There is a background of moderate centrilobular emphysema with mild diffuse bronchial wall thickening. No pleural effusions. Small amount of scarring in the inferior segment of the lingula.  Musculoskeletal: There are no aggressive appearing lytic or blastic lesions noted in the visualized portions of the skeleton.  CT ABDOMEN AND PELVIS FINDINGS  Hepatobiliary: No focal cystic or solid hepatic lesions. No intra or extrahepatic biliary ductal dilatation. Gallbladder is normal in appearance.  Pancreas: Unremarkable.  Spleen: Unremarkable.  Adrenals/Urinary Tract: Extensive atrophy in the lower pole of the right kidney, presumably from prior infection/ inflammation. Sub cm low-attenuation lesions in the kidneys bilaterally too small to characterize, but favored to represent small cysts. In addition, there is a 1.6 cm simple cyst in the interpolar region of the left kidney. No hydroureteronephrosis. Urinary bladder is normal in appearance.  Stomach/Bowel: Normal appearance of the stomach. No pathologic dilatation of small bowel or colon. Normal appendix (retrocecal).  Vascular/Lymphatic: Moderate to severe stenoses at the origins of the celiac axis and superior mesenteric arteries. Complete occlusion of the abdominal aorta immediately beneath the origin of the renal arteries. This occlusion extends into the pelvis where the common iliac arteries, internal iliac arteries and external iliac arteries are all completely occluded bilaterally. There is reconstitution of flow in the common femoral arteries bilaterally, and there are multiple collateral arteries noted in the pelvic sidewall bilaterally. IMA origin is also chronically occluded, although the distal aspect of the vessel is patent secondary to a collateral pathway which appears to involve a branch from the proximal superior mesenteric artery.  No lymphadenopathy noted in the abdomen or pelvis.  Reproductive: Prostate gland and seminal vesicles are unremarkable in appearance.  Other: No significant volume of ascites.  No pneumoperitoneum.  Musculoskeletal: There are no aggressive appearing lytic or blastic lesions noted in the visualized portions of the skeleton.  IMPRESSION: 1. 4.3 x 3.7 x 4.3 cm thick-walled cavitary mass in the superior segment of the left lower lobe with associated left hilar adenopathy, and previously diagnosed brain metastases. Findings are compatible with stage IV lung cancer (T2A, N1, M1b). 2. Extensive airspace consolidation in the medial aspect of the left lower lobe. Although this could in part related to postobstructive changes, the position of lesion argues against this. This may simply represent endobronchial spread of hemorrhage or secretions from the lesion, as lesion does appear to communicate with the airways. Alternatively, this could represent some lymphangitic spread  of tumor, however, that is not strongly favored at this time. 3. 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 In addition, there is left main and 2 vessel coronary artery disease. 4. Additional incidental findings, as above.   Electronically Signed   By: Vinnie Langton M.D.   On: 07/03/2014 22:00   Ct Biopsy  07/07/2014   CLINICAL DATA:  Liver lower lobe lung mass  EXAM: CT-GUIDED BIOPSY OF A LEFT LOWER LOBE LUNG MASS.  CORE.  MEDICATIONS AND MEDICAL HISTORY: Versed 1 mg, Fentanyl 50 mcg.  Additional Medications: None.   ANESTHESIA/SEDATION: Moderate sedation time: 5 minutes  PROCEDURE: The procedure, risks, benefits, and alternatives were explained to the patient. Questions regarding the procedure were encouraged and answered. The patient understands and consents to the procedure.  The posterior thorax was prepped with Betadine in a sterile fashion, and a sterile drape was applied covering the operative field. A sterile gown and sterile gloves were used for the procedure.  Under CT guidance, a(n) 17 gauge guide needle was advanced into the cavitary left lower lobe lung mass. Subsequently 3 18 gauge core biopsies were obtained. The guide needle was removed. Final imaging was performed.  Patient tolerated the procedure well without complication. Vital sign monitoring by nursing staff during the procedure will continue as patient is in the special procedures unit for post procedure observation.  FINDINGS: The images document guide needle placement within the left lower lobe lung mass. Post biopsy images demonstrate no pneumothorax.  COMPLICATIONS: None  IMPRESSION: Successful CT-guided core biopsy of a left lower lobe lung mass.   Electronically Signed   By: Maryclare Bean M.D.   On: 07/07/2014 15:05    ASSESSMENT: This is a very pleasant 68 years old white male recently diagnosed with a stage IV (T2a, N1, M1b) non-small cell lung cancer, poorly differentiated carcinoma with glandular and squamous differentiation diagnosed in November 2015 presented with right lower lobe cavitary lesion in addition to left hilar lymphadenopathy and metastatic brain lesions.   PLAN: I had a lengthy discussion with the patient and his wife today about his current disease stage, prognosis and treatment options. I explained to the patient and his wife that he has incurable condition and/or the treatment will be offered palliative nature. I gave the patient the option of palliative systemic chemotherapy versus palliative care and hospice referral. He is  interested in treatment. I will complete the staging workup by ordering a PET scan. I recommended for the patient to continue on Decadron 4 mg by mouth 3 times a day for now until he undergoes treatment for his metastatic brain lesion either with surgical resection or stereotactic radiotherapy or a combination of both. He is followed by Dr. Tammi Klippel and Dr. Sherley Bounds. He is expected to have this procedure next week. After completion of the treatment for his brain lesions, I would consider the patient for systemic chemotherapy with carboplatin and paclitaxel. I would also send his tissue to be tested for molecular biomarkers as well as PDL 1.  I will arrange for the patient to come back for follow-up visit in 2 weeks for reevaluation and more detailed  discussion of his treatment options after completion of the brain radiation. I gave the patient and his wife the time to ask questions and I answered them completely to their satisfaction. I also reviewed the images of the scans and MRI with the patient and his wife. He was advised to call immediately if he has any concerning symptoms in the interval. The patient voices understanding of current disease status and treatment options and is in agreement with the current care plan.  All questions were answered. The patient knows to call the clinic with any problems, questions or concerns. We can certainly see the patient much sooner if necessary.  Thank you so much for allowing me to participate in the care of Lajuana Carry. I will continue to follow up the patient with you and assist in his care.  I spent 40 minutes counseling the patient face to face. The total time spent in the appointment was 65 minutes.  Disclaimer: This note was dictated with voice recognition software. Similar sounding words can inadvertently be transcribed and may not be corrected upon review.   Tykerria Mccubbins K. 07/11/2014, 10:42 AM

## 2014-07-11 NOTE — Patient Instructions (Signed)
Smoking Cessation, Tips for Success  If you are ready to quit smoking, congratulations! You have chosen to help yourself be healthier. Cigarettes bring nicotine, tar, carbon monoxide, and other irritants into your body. Your lungs, heart, and blood vessels will be able to work better without these poisons. There are many different ways to quit smoking. Nicotine gum, nicotine patches, a nicotine inhaler, or nicotine nasal spray can help with physical craving. Hypnosis, support groups, and medicines help break the habit of smoking.  WHAT THINGS CAN I DO TO MAKE QUITTING EASIER?   Here are some tips to help you quit for good:  · Pick a date when you will quit smoking completely. Tell all of your friends and family about your plan to quit on that date.  · Do not try to slowly cut down on the number of cigarettes you are smoking. Pick a quit date and quit smoking completely starting on that day.  · Throw away all cigarettes.    · Clean and remove all ashtrays from your home, work, and car.  · On a card, write down your reasons for quitting. Carry the card with you and read it when you get the urge to smoke.  · Cleanse your body of nicotine. Drink enough water and fluids to keep your urine clear or pale yellow. Do this after quitting to flush the nicotine from your body.  · Learn to predict your moods. Do not let a bad situation be your excuse to have a cigarette. Some situations in your life might tempt you into wanting a cigarette.  · Never have "just one" cigarette. It leads to wanting another and another. Remind yourself of your decision to quit.  · Change habits associated with smoking. If you smoked while driving or when feeling stressed, try other activities to replace smoking. Stand up when drinking your coffee. Brush your teeth after eating. Sit in a different chair when you read the paper. Avoid alcohol while trying to quit, and try to drink fewer caffeinated beverages. Alcohol and caffeine may urge you to  smoke.  · Avoid foods and drinks that can trigger a desire to smoke, such as sugary or spicy foods and alcohol.  · Ask people who smoke not to smoke around you.  · Have something planned to do right after eating or having a cup of coffee. For example, plan to take a walk or exercise.  · Try a relaxation exercise to calm you down and decrease your stress. Remember, you may be tense and nervous for the first 2 weeks after you quit, but this will pass.  · Find new activities to keep your hands busy. Play with a pen, coin, or rubber band. Doodle or draw things on paper.  · Brush your teeth right after eating. This will help cut down on the craving for the taste of tobacco after meals. You can also try mouthwash.    · Use oral substitutes in place of cigarettes. Try using lemon drops, carrots, cinnamon sticks, or chewing gum. Keep them handy so they are available when you have the urge to smoke.  · When you have the urge to smoke, try deep breathing.  · Designate your home as a nonsmoking area.  · If you are a heavy smoker, ask your health care provider about a prescription for nicotine chewing gum. It can ease your withdrawal from nicotine.  · Reward yourself. Set aside the cigarette money you save and buy yourself something nice.  · Look for   support from others. Join a support group or smoking cessation program. Ask someone at home or at work to help you with your plan to quit smoking.  · Always ask yourself, "Do I need this cigarette or is this just a reflex?" Tell yourself, "Today, I choose not to smoke," or "I do not want to smoke." You are reminding yourself of your decision to quit.  · Do not replace cigarette smoking with electronic cigarettes (commonly called e-cigarettes). The safety of e-cigarettes is unknown, and some may contain harmful chemicals.  · If you relapse, do not give up! Plan ahead and think about what you will do the next time you get the urge to smoke.  HOW WILL I FEEL WHEN I QUIT SMOKING?  You  may have symptoms of withdrawal because your body is used to nicotine (the addictive substance in cigarettes). You may crave cigarettes, be irritable, feel very hungry, cough often, get headaches, or have difficulty concentrating. The withdrawal symptoms are only temporary. They are strongest when you first quit but will go away within 10-14 days. When withdrawal symptoms occur, stay in control. Think about your reasons for quitting. Remind yourself that these are signs that your body is healing and getting used to being without cigarettes. Remember that withdrawal symptoms are easier to treat than the major diseases that smoking can cause.   Even after the withdrawal is over, expect periodic urges to smoke. However, these cravings are generally short lived and will go away whether you smoke or not. Do not smoke!  WHAT RESOURCES ARE AVAILABLE TO HELP ME QUIT SMOKING?  Your health care provider can direct you to community resources or hospitals for support, which may include:  · Group support.  · Education.  · Hypnosis.  · Therapy.  Document Released: 04/22/2004 Document Revised: 12/09/2013 Document Reviewed: 01/10/2013  [employer]® Patient Information ©2015 [employer]. This information is not intended to replace advice given to you by your health care provider. Make sure you discuss any questions you have with your health care provider.

## 2014-07-13 ENCOUNTER — Encounter: Payer: Self-pay | Admitting: Radiation Oncology

## 2014-07-13 NOTE — Progress Notes (Signed)
Location/Histology of Brain Tumor:Three ring enhancing mass lesions in the right hemisphere involving the right frontal lobe, right parietal lobe and right occipital lobe  Patient presented with symptoms of:  Progressive headache and difficulty with gait  Past or anticipated interventions, if any, per neurosurgery: no  Past or anticipated interventions, if any, per medical oncology: yes  Dose of Decadron, if applicable: yes, 4 mg TID  Recent neurologic symptoms, if any:   Seizures: no  Headaches: yes  Nausea: no  Dizziness/ataxia: no  Difficulty with hand coordination: no  Focal numbness/weakness: no  Visual deficits/changes: no  Confusion/Memory deficits: no  Painful bone metastases at present, if any: no  SAFETY ISSUES:  Prior radiation? no  Pacemaker/ICD? no  Possible current pregnancy? no  Is the patient on methotrexate? no  Additional Complaints / other details: 68 year old male.

## 2014-07-13 NOTE — Progress Notes (Signed)
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 Brain metastasis 198.3 C79.31    Narrative:  The patient returns today for routine follow-up.  I saw him as an inpatient consultation on 07/04/2014. Since that time, the patient had lung biopsy confirming non-small cell carcinoma. He returns today to coordinate treatment of his 3 isolated brain metastases.                              ALLERGIES:  is allergic to doxycycline and penicillins.  Meds: Current Outpatient Prescriptions  Medication Sig Dispense Refill  . dexamethasone (DECADRON) 4 MG tablet Take 1 tablet (4 mg total) by mouth 3 (three) times daily. 90 tablet 0  . lisinopril (PRINIVIL,ZESTRIL) 10 MG tablet Take 10 mg by mouth daily.    . Omega-3 Fatty Acids (FISH OIL) 1000 MG CAPS Take by mouth.     No current facility-administered medications for this encounter.    Physical Findings: The patient is in no acute distress. Patient is alert and oriented.  vitals were not taken for this visit..  No significant changes.  Lab Findings: Lab Results  Component Value Date   WBC 12.0* 07/07/2014   HGB 11.8* 07/07/2014   HCT 34.3* 07/07/2014   MCV 87.7 07/07/2014   PLT 195 07/07/2014    @LASTCHEM @  Radiographic Findings: Dg Chest 1 View  07/07/2014   CLINICAL DATA:  Status post left lung biopsy  EXAM: CHEST - 1 VIEW  COMPARISON:  07/07/2014  FINDINGS: Status post left lung biopsy. Left lower lobe reticular nodular interstitial disease with a cavitary mass in the superior segment of the left lower lobe. The right lung is clear. There is no pneumothorax. Stable cardiomediastinal silhouette. Unremarkable osseous  structures.  IMPRESSION: 1. No left pneumothorax status post left lung biopsy.   Electronically Signed   By: Kathreen Devoid   On: 07/07/2014 15:53   Dg Chest 2 View  07/03/2014   CLINICAL DATA:  Chest pain, brain metastasis on CT same day  EXAM: CHEST  2 VIEW  COMPARISON:  Chest radiograph 06/30/2014  FINDINGS: Normal cardiac silhouette. The left mid lung lesion measures 14 mm decreased from 22 mm on prior. There is airspace disease in lower lobe seen on lateral projection. No aggressive osseous lesion.  IMPRESSION: 1. The left lung lesion is smaller than comparison exam but still concerning for lung cancer given the brain lesions. Recommend CT thorax with contrast for further evaluation.  2.  Potential left lower lobe pneumonia.   Electronically Signed   By: Suzy Bouchard M.D.   On: 07/03/2014 18:20   Ct Head Wo Contrast  07/03/2014   ADDENDUM REPORT: 07/03/2014 18:25  ADDENDUM: A chest x-ray was performed following the head CT. The left lung lesion is decreased in size over 3 day interval. This raises the concern that the pulmonary lesions could be cerebral abscesses. Metastatic disease remains in differential. Recommend MRI of the brain with and without contrast to distinguish between infection and neoplasm.  Additional findings and recommendations conveyed toJASON MESNER on 07/03/2014 at18:24.   Electronically Signed   By:  Suzy Bouchard M.D.   On: 07/03/2014 18:25   07/03/2014   CLINICAL DATA:  Gait abnormality  EXAM: CT HEAD WITHOUT CONTRAST  TECHNIQUE: Contiguous axial images were obtained from the base of the skull through the vertex without intravenous contrast.  COMPARISON:  Head CT 10/19/2005 on chest radiograph 07/30/2014  FINDINGS: There is round lesion in right frontal lobe measuring 30 mm which has centrally low-attenuation. There is a large field of a the vasogenic edema surrounding this intraparenchymal lesion. There is a subfalcine herniation in the right frontal lobe with 7 cm of  leftward midline shift. There is a second lesion in the right occipital lobe measuring 2.3 cm also with a large field of vasogenic edema.  There is compression of the lateral ventricles on the right. The quadrigeminal plate cistern is effaced on the left. There is 1e mm of midline shift centrally at the level of the ventricles. No intraparenchymal hemorrhage.  IMPRESSION: 1. Metastatic lesions in the right frontal lobe and right occipital lobe with extensive vasogenic edema. 2. Moderate leftward midline shift. 3. Effacement of the quadrigeminal plate cistern on the left. Subfalcine herniation anteriorly. 4. Patient has a lesion in the left lung on chest x-ray therefore this likely represents metastatic lung cancer. Critical Value/emergent results were called by telephone at the time of interpretation on 07/03/2014 at 6:13 pm to Dr. Merrily Pew , who verbally acknowledged these results.  Electronically Signed: By: Suzy Bouchard M.D. On: 07/03/2014 18:14   Ct Chest W Contrast  07/03/2014   CLINICAL DATA:  68 year old male with lung lesion and brain mass. Evaluate for underlying malignancy.  EXAM: CT CHEST, ABDOMEN, AND PELVIS WITH CONTRAST  TECHNIQUE: Multidetector CT imaging of the chest, abdomen and pelvis was performed following the standard protocol during bolus administration of intravenous contrast.  CONTRAST:  135mL OMNIPAQUE IOHEXOL 300 MG/ML  SOLN  COMPARISON:  No priors.  FINDINGS: CT CHEST FINDINGS  Mediastinum: Heart size is normal. There is no significant pericardial fluid, thickening or pericardial calcification. There is atherosclerosis of the thoracic aorta, the great vessels of the mediastinum and the coronary arteries, including calcified atherosclerotic plaque in the left main, left anterior descending and right coronary arteries. Left hilar lymphadenopathy measuring up to 19 x 27 mm (image 26 of series 201). No other mediastinal or right hilar adenopathy is noted. Esophagus is unremarkable  in appearance. Separate origin of the left vertebral artery directly off the aortic arch (normal anatomical variant) incidentally noted.  Lungs/Pleura: Centered in the superior segment of the left lower lobe is a 4.3 x 3.7 x 4.0 cm thick-walled cavitary mass, highly suspicious for a cavitary neoplasm. Adjacent to this within the medial aspect of the left lower lobe there is extensive airspace consolidation and septal thickening. Focal pleuroparenchymal architectural distortion in the apex of the right upper lobe is strongly favored to be chronic post infectious or inflammatory scarring. No other suspicious appearing pulmonary nodules or masses are noted at this time. There is a background of moderate centrilobular emphysema with mild diffuse bronchial wall thickening. No pleural effusions. Small amount of scarring in the inferior segment of the lingula.  Musculoskeletal: There are no aggressive appearing lytic or blastic lesions noted in the visualized portions of the skeleton.  CT ABDOMEN AND PELVIS FINDINGS  Hepatobiliary: No focal cystic or solid hepatic lesions. No intra or extrahepatic biliary ductal dilatation. Gallbladder is normal in appearance.  Pancreas: Unremarkable.  Spleen: Unremarkable.  Adrenals/Urinary Tract: Extensive atrophy in the lower pole  of the right kidney, presumably from prior infection/ inflammation. Sub cm low-attenuation lesions in the kidneys bilaterally too small to characterize, but favored to represent small cysts. In addition, there is a 1.6 cm simple cyst in the interpolar region of the left kidney. No hydroureteronephrosis. Urinary bladder is normal in appearance.  Stomach/Bowel: Normal appearance of the stomach. No pathologic dilatation of small bowel or colon. Normal appendix (retrocecal).  Vascular/Lymphatic: Moderate to severe stenoses at the origins of the celiac axis and superior mesenteric arteries. Complete occlusion of the abdominal aorta immediately beneath the origin of  the renal arteries. This occlusion extends into the pelvis where the common iliac arteries, internal iliac arteries and external iliac arteries are all completely occluded bilaterally. There is reconstitution of flow in the common femoral arteries bilaterally, and there are multiple collateral arteries noted in the pelvic sidewall bilaterally. IMA origin is also chronically occluded, although the distal aspect of the vessel is patent secondary to a collateral pathway which appears to involve a branch from the proximal superior mesenteric artery. No lymphadenopathy noted in the abdomen or pelvis.  Reproductive: Prostate gland and seminal vesicles are unremarkable in appearance.  Other: No significant volume of ascites.  No pneumoperitoneum.  Musculoskeletal: There are no aggressive appearing lytic or blastic lesions noted in the visualized portions of the skeleton.  IMPRESSION: 1. 4.3 x 3.7 x 4.3 cm thick-walled cavitary mass in the superior segment of the left lower lobe with associated left hilar adenopathy, and previously diagnosed brain metastases. Findings are compatible with stage IV lung cancer (T2A, N1, M1b). 2. Extensive airspace consolidation in the medial aspect of the left lower lobe. Although this could in part related to postobstructive changes, the position of lesion argues against this. This may simply represent endobronchial spread of hemorrhage or secretions from the lesion, as lesion does appear to communicate with the airways. Alternatively, this could represent some lymphangitic spread of tumor, however, that is not strongly favored at this time. 3. 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 In addition, there is left main and 2 vessel coronary artery disease. 4. Additional incidental findings, as above.   Electronically Signed   By: Vinnie Langton M.D.   On: 07/03/2014 22:00   Mr Jeri Cos Contrast  07/04/2014   CLINICAL DATA:  Abnormal head CT with brain mass. Lung mass on chest CT. Rule out metastatic disease versus abscess.  EXAM: MRI HEAD WITHOUT AND WITH CONTRAST  TECHNIQUE: Multiplanar, multiecho pulse sequences of the brain and surrounding structures were obtained without and with intravenous contrast.  CONTRAST:  3mL MULTIHANCE GADOBENATE DIMEGLUMINE 529 MG/ML IV SOLN  COMPARISON:  CT head 07/03/2014  FINDINGS: Extensive cerebral edema is present in the right frontal and parietal lobes related to ring-enhancing mass lesions. There is mass-effect and midline shift measuring 13 mm to the left.  Right frontal lobe ring-enhancing mass lesion measures 3 x 4 cm. There is a focal thickened area of enhancement anterior to the cavitary mass. There is internal hemorrhage within the mass and a large amount of surrounding edema. The wall of the mass is a relatively thin and enhances homogeneously  Right parietal ring-enhancing mass lesion measures 2.5 x 3.0 cm and has a large amount of surrounding edema with a mild amount of internal hemorrhage. The wall of this mass is thin and relatively homogeneous in thickness.  Small enhancing lesion in the right occipital lobe measures 8 mm in also has a small amount of hemorrhage and surrounding edema.  No enhancing lesions are seen in the left hemisphere.  Diffusion-weighted imaging reveals restricted diffusion within the 3 lesions on the right. No acute infarct is identified.  Ventricle size is normal.  Brainstem and cerebellum normal.  IMPRESSION: Three ring-enhancing mass lesions in the right hemisphere involving the right  frontal lobe, right parietal lobe, and right occipital lobe. These lesions show restricted diffusion and internal hemorrhage and contain a large amount of surrounding vasogenic edema. 13 mm midline shift to the left.  Differential diagnosis for these lesions includes metastatic disease and brain abscess. Currently the patient is not have an elevated white count or fever. There is a left lower lobe lung mass with left hilar adenopathy. The findings are most likely related to carcinoma lung with metastatic disease to the brain.   Electronically Signed   By: Franchot Gallo M.D.   On: 07/04/2014 15:08   Ct Abdomen Pelvis W Contrast  07/03/2014   CLINICAL DATA:  68 year old male with lung lesion and brain mass. Evaluate for underlying malignancy.  EXAM: CT CHEST, ABDOMEN, AND PELVIS WITH CONTRAST  TECHNIQUE: Multidetector CT imaging of the chest, abdomen and pelvis was performed following the standard protocol during bolus administration of intravenous contrast.  CONTRAST:  157mL OMNIPAQUE IOHEXOL 300 MG/ML  SOLN  COMPARISON:  No priors.  FINDINGS: CT CHEST FINDINGS  Mediastinum: Heart size is normal. There is no significant pericardial fluid, thickening or pericardial calcification. There is atherosclerosis of the thoracic aorta, the great vessels of the mediastinum and the coronary arteries, including calcified atherosclerotic plaque in the left main, left anterior descending and right coronary arteries. Left hilar lymphadenopathy measuring up to 19 x 27 mm (image 26 of series 201). No other mediastinal or right hilar adenopathy is noted. Esophagus is unremarkable in appearance. Separate origin of the left vertebral artery directly off the aortic arch (normal anatomical variant) incidentally noted.  Lungs/Pleura: Centered in the superior segment of the left lower lobe is a 4.3 x 3.7 x 4.0 cm thick-walled cavitary mass, highly suspicious for a cavitary neoplasm. Adjacent to this within the medial aspect of the left  lower lobe there is extensive airspace consolidation and septal thickening. Focal pleuroparenchymal architectural distortion in the apex of the right upper lobe is strongly favored to be chronic post infectious or inflammatory scarring. No other suspicious appearing pulmonary nodules or masses are noted at this time. There is a background of moderate centrilobular emphysema with mild diffuse bronchial wall thickening. No pleural effusions. Small amount of scarring in the inferior segment of the lingula.  Musculoskeletal: There are no aggressive appearing lytic or blastic lesions noted in the visualized portions of the skeleton.  CT ABDOMEN AND PELVIS FINDINGS  Hepatobiliary: No focal cystic or solid hepatic lesions. No intra or extrahepatic biliary ductal dilatation. Gallbladder is normal in appearance.  Pancreas: Unremarkable.  Spleen: Unremarkable.  Adrenals/Urinary Tract: Extensive atrophy in the lower pole of the right kidney, presumably from prior infection/ inflammation. Sub cm low-attenuation lesions in the kidneys bilaterally too small to characterize, but favored to represent small cysts. In addition, there is a 1.6 cm simple cyst in the interpolar region of  the left kidney. No hydroureteronephrosis. Urinary bladder is normal in appearance.  Stomach/Bowel: Normal appearance of the stomach. No pathologic dilatation of small bowel or colon. Normal appendix (retrocecal).  Vascular/Lymphatic: Moderate to severe stenoses at the origins of the celiac axis and superior mesenteric arteries. Complete occlusion of the abdominal aorta immediately beneath the origin of the renal arteries. This occlusion extends into the pelvis where the common iliac arteries, internal iliac arteries and external iliac arteries are all completely occluded bilaterally. There is reconstitution of flow in the common femoral arteries bilaterally, and there are multiple collateral arteries noted in the pelvic sidewall bilaterally. IMA origin  is also chronically occluded, although the distal aspect of the vessel is patent secondary to a collateral pathway which appears to involve a branch from the proximal superior mesenteric artery. No lymphadenopathy noted in the abdomen or pelvis.  Reproductive: Prostate gland and seminal vesicles are unremarkable in appearance.  Other: No significant volume of ascites.  No pneumoperitoneum.  Musculoskeletal: There are no aggressive appearing lytic or blastic lesions noted in the visualized portions of the skeleton.  IMPRESSION: 1. 4.3 x 3.7 x 4.3 cm thick-walled cavitary mass in the superior segment of the left lower lobe with associated left hilar adenopathy, and previously diagnosed brain metastases. Findings are compatible with stage IV lung cancer (T2A, N1, M1b). 2. Extensive airspace consolidation in the medial aspect of the left lower lobe. Although this could in part related to postobstructive changes, the position of lesion argues against this. This may simply represent endobronchial spread of hemorrhage or secretions from the lesion, as lesion does appear to communicate with the airways. Alternatively, this could represent some lymphangitic spread of tumor, however, that is not strongly favored at this time. 3. 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 In addition, there is left main and 2 vessel coronary artery disease. 4. Additional incidental findings, as above.   Electronically Signed   By: Vinnie Langton M.D.   On: 07/03/2014 22:00   Ct Biopsy  07/07/2014   CLINICAL  DATA:  Liver lower lobe lung mass  EXAM: CT-GUIDED BIOPSY OF A LEFT LOWER LOBE LUNG MASS.  CORE.  MEDICATIONS AND MEDICAL HISTORY: Versed 1 mg, Fentanyl 50 mcg.  Additional Medications: None.  ANESTHESIA/SEDATION: Moderate sedation time: 5 minutes  PROCEDURE: The procedure, risks, benefits, and alternatives were explained to the patient. Questions regarding the procedure were encouraged and answered. The patient understands and consents to the procedure.  The posterior thorax was prepped with Betadine in a sterile fashion, and a sterile drape was applied covering the operative field. A sterile gown and sterile gloves were used for the procedure.  Under CT guidance, a(n) 17 gauge guide needle was advanced into the cavitary left lower lobe lung mass. Subsequently 3 18 gauge core biopsies were obtained. The guide needle was removed. Final imaging was performed.  Patient tolerated the procedure well without complication. Vital sign monitoring by nursing staff during the procedure will continue as patient is in the special procedures unit for post procedure observation.  FINDINGS: The images document guide needle placement within the left lower lobe lung mass. Post biopsy images  demonstrate no pneumothorax.  COMPLICATIONS: None  IMPRESSION: Successful CT-guided core biopsy of a left lower lobe lung mass.   Electronically Signed   By: Maryclare Bean M.D.   On: 07/07/2014 15:05    Impression:  The patient is a very nice 68 year old gentleman with 3 isolated brain metastases from non-small cell carcinoma of the left lung.  At this point, the patient would potentially benefit from radiotherapy. The options include whole brain irradiation versus stereotactic radiosurgery. There are pros and cons associated with each of these potential treatment options. Whole brain radiotherapy would treat the known metastatic deposits and help provide some reduction of risk for future brain metastases. However, whole brain radiotherapy carries  potential risks including hair loss, subacute somnolence, and neurocognitive changes including a possible reduction in short-term memory. Whole brain radiotherapy also may carry a lower likelihood of tumor control at the treatment sites because of the low-dose used. Stereotactic radiosurgery carries a higher likelihood for local tumor control at the targeted sites with lower associated risk for neurocognitive changes such as memory loss. However, the use of stereotactic radiosurgery in this setting may leave the patient at increased risk for new brain metastases elsewhere in the brain as high as 50-60%. Accordingly, patients who receive stereotactic radiosurgery in this setting should undergo ongoing surveillance imaging with brain MRI more frequently in order to identify and treat new small brain metastases before they become symptomatic. Stereotactic radiosurgery does carry some different risks, including a risk of radionecrosis.   PLAN: Today, I reviewed the findings and workup thus far with the patient. We discussed the dilemma regarding whole brain radiotherapy versus stereotactic radiosurgery. We discussed the pros and cons of each. We also discussed the logistics and delivery of each. We reviewed the results associated with each of the treatments described above. The patient seems to understand the treatment options and would like to proceed with stereotactic radiosurgery.  I spent 60 minutes minutes face to face with the patient and more than 50% of that time was spent in counseling and/or coordination of care.   In addition to treatment of his brain metastases, the patient is in need of staging PET/CT and medical oncology consultation which are scheduled on December 15 and December 17 respectively.  _____________________________________  Sheral Apley Tammi Klippel, M.D.

## 2014-07-13 NOTE — Progress Notes (Signed)
  Radiation Oncology         ([phone]) [phone] ________________________________  Name: Anthony Lopez MRN: [id number]  Date: 07/14/2014  DOB: [birth year]  SIMULATION AND TREATMENT PLANNING NOTE    ICD-9-CM ICD-10-CM   1. Brain metastasis 198.3 C79.31    DIAGNOSIS:  68 year old gentleman with 3 isolated brain metastases from non-small cell carcinoma of the left lung  NARRATIVE:  The patient was brought to the [facility].  Identity was confirmed.  All relevant records and images related to the planned course of therapy were reviewed.  The patient freely provided informed written consent to proceed with treatment after reviewing the details related to the planned course of therapy. The consent form was witnessed and verified by the simulation staff. Intravenous access was established for contrast administration. Then, the patient was set-up in a stable reproducible supine position for radiation therapy.  A relocatable thermoplastic stereotactic head frame was fabricated for precise immobilization.  CT images were obtained.  Surface markings were placed.  The CT images were loaded into the planning software and fused with the patient's targeting MRI scan.  Then the target and avoidance structures were contoured.  Treatment planning then occurred.  The radiation prescription was entered and confirmed.  I have requested 3D planning  I have requested a DVH of the following structures: Brain stem, brain, left eye, right eye, lenses, optic chiasm, target volumes, uninvolved brain, and normal tissue.    PLAN:  The patient will receive between 15 and 20 Gy in one fraction to his 3 brain metastases.  ________________________________  Sheral Apley Tammi Klippel, M.D.

## 2014-07-14 ENCOUNTER — Ambulatory Visit: Admission: RE | Admit: 2014-07-14 | Payer: Medicare HMO | Source: Ambulatory Visit

## 2014-07-14 ENCOUNTER — Ambulatory Visit
Admission: RE | Admit: 2014-07-14 | Discharge: 2014-07-14 | Disposition: A | Discharge status: Home / Self Care | Payer: Medicare HMO | Source: Ambulatory Visit | Attending: Radiation Oncology | Admitting: Radiation Oncology

## 2014-07-14 ENCOUNTER — Encounter: Payer: Self-pay | Admitting: Radiation Oncology

## 2014-07-14 VITALS — BP 119/59 | HR 63 | Resp 16 | Wt 135.2 lb

## 2014-07-14 DIAGNOSIS — C7931 Secondary malignant neoplasm of brain: Secondary | ICD-10-CM

## 2014-07-14 DIAGNOSIS — Z7952 Long term (current) use of systemic steroids: Secondary | ICD-10-CM | POA: Diagnosis not present

## 2014-07-14 DIAGNOSIS — C3492 Malignant neoplasm of unspecified part of left bronchus or lung: Secondary | ICD-10-CM | POA: Diagnosis not present

## 2014-07-14 DIAGNOSIS — Z510 Encounter for antineoplastic radiation therapy: Secondary | ICD-10-CM | POA: Diagnosis not present

## 2014-07-14 NOTE — Progress Notes (Signed)
See progress note under physician encounter. 

## 2014-07-14 NOTE — Progress Notes (Signed)
Taking decadron 4 mg tid. Continues to have headaches. Continues to smoke. States, "it takes me four days to smoke one pack of cigarettes." Patient reports that he is trying to quit smoking. Reports dizziness upon standing. Reports one episode of confusion following hospital discharge. Weight and vitals stable. Denies pain. Reports increased reflux. Patient understands now that increased GERD is related to effects of decadron. Patient and wife plan to pick up prevacid after today's appointment. Reports occasional cough with hemoptysis. Denies shortness of breath. Denies seizure activity. Denies difficulty with hand coordination, visual difficulties, numbness and numbness/weakness. Denies prior radiation hx. Denies having a pacemaker.

## 2014-07-15 ENCOUNTER — Telehealth: Payer: Self-pay | Admitting: Internal Medicine

## 2014-07-15 ENCOUNTER — Institutional Professional Consult (permissible substitution): Payer: Medicare HMO | Admitting: Internal Medicine

## 2014-07-15 ENCOUNTER — Telehealth: Payer: Self-pay | Admitting: *Deleted

## 2014-07-15 ENCOUNTER — Other Ambulatory Visit: Payer: Self-pay | Admitting: *Deleted

## 2014-07-15 ENCOUNTER — Ambulatory Visit
Admission: RE | Admit: 2014-07-15 | Discharge: 2014-07-15 | Disposition: A | Discharge status: Home / Self Care | Payer: Medicare HMO | Source: Ambulatory Visit | Attending: Radiation Oncology | Admitting: Radiation Oncology

## 2014-07-15 DIAGNOSIS — C3432 Malignant neoplasm of lower lobe, left bronchus or lung: Secondary | ICD-10-CM

## 2014-07-15 DIAGNOSIS — C7949 Secondary malignant neoplasm of other parts of nervous system: Principal | ICD-10-CM

## 2014-07-15 DIAGNOSIS — C7931 Secondary malignant neoplasm of brain: Secondary | ICD-10-CM

## 2014-07-15 MED ORDER — GADOBENATE DIMEGLUMINE 529 MG/ML IV SOLN
13.0000 mL | Freq: Once | INTRAVENOUS | Status: AC | PRN
  Administered 2014-07-15: 13 mL via INTRAVENOUS

## 2014-07-15 NOTE — Telephone Encounter (Signed)
Lft msg for pt confirming labs/ov/nut per 12/08 POF, moved Nut to be same day with labs/ov, mailed out schedule.... KJ

## 2014-07-15 NOTE — Progress Notes (Signed)
Patient and wife requested a nutrition appointment with Anthony Lopez. Patient understands our staff will contact him with this appointment. [name] committed to make the appointment and contact the patient.

## 2014-07-15 NOTE — Telephone Encounter (Signed)
Called patient's wife- Curt Bears to inform of nutrition appt. On 07-23-14 @ 9:45 am, lvm for a return call

## 2014-07-16 DIAGNOSIS — Z510 Encounter for antineoplastic radiation therapy: Secondary | ICD-10-CM | POA: Diagnosis not present

## 2014-07-17 ENCOUNTER — Other Ambulatory Visit: Payer: Medicare HMO

## 2014-07-17 NOTE — Pre-Procedure Instructions (Signed)
Anthony Lopez  07/17/2014   Your procedure is scheduled on:  07/23/14  Report to [facility] Admitting at 1030 AM.  Call this number if you have problems the morning of surgery: [phone]   Remember:   Do not eat food or drink liquids after midnight.   Take these medicines the morning of surgery with A SIP OF WATER: decadron   Do not wear jewelry, make-up or nail polish.  Do not wear lotions, powders, or perfumes. You may wear deodorant.  Do not shave 48 hours prior to surgery. Men may shave face and neck.  Do not bring valuables to the hospital.  [facility] is not responsible                  for any belongings or valuables.               Contacts, dentures or bridgework may not be worn into surgery.  Leave suitcase in the car. After surgery it may be brought to your room.  For patients admitted to the hospital, discharge time is determined by your                treatment team.               Patients discharged the day of surgery will not be allowed to drive  home.  Name and phone number of your driver: family  Special Instructions: Shower using CHG 2 nights before surgery and the night before surgery.  If you shower the day of surgery use CHG.  Use special wash - you have one bottle of CHG for all showers.  You should use approximately 1/3 of the bottle for each shower.   Please read over the following fact sheets that you were given: Pain Booklet, Coughing and Deep Breathing, Blood Transfusion Information and Surgical Site Infection Prevention

## 2014-07-18 ENCOUNTER — Encounter (HOSPITAL_COMMUNITY)
Admission: RE | Admit: 2014-07-18 | Discharge: 2014-07-18 | Disposition: A | Discharge status: Home / Self Care | Payer: Medicare HMO | Source: Ambulatory Visit | Attending: Neurological Surgery | Admitting: Neurological Surgery

## 2014-07-18 ENCOUNTER — Encounter (HOSPITAL_COMMUNITY): Payer: Self-pay

## 2014-07-18 DIAGNOSIS — F419 Anxiety disorder, unspecified: Secondary | ICD-10-CM | POA: Diagnosis not present

## 2014-07-18 DIAGNOSIS — F17210 Nicotine dependence, cigarettes, uncomplicated: Secondary | ICD-10-CM | POA: Insufficient documentation

## 2014-07-18 DIAGNOSIS — I00 Rheumatic fever without heart involvement: Secondary | ICD-10-CM | POA: Diagnosis not present

## 2014-07-18 DIAGNOSIS — I447 Left bundle-branch block, unspecified: Secondary | ICD-10-CM | POA: Insufficient documentation

## 2014-07-18 DIAGNOSIS — Z01818 Encounter for other preprocedural examination: Secondary | ICD-10-CM | POA: Insufficient documentation

## 2014-07-18 DIAGNOSIS — Z8673 Personal history of transient ischemic attack (TIA), and cerebral infarction without residual deficits: Secondary | ICD-10-CM | POA: Diagnosis not present

## 2014-07-18 DIAGNOSIS — I10 Essential (primary) hypertension: Secondary | ICD-10-CM | POA: Insufficient documentation

## 2014-07-18 DIAGNOSIS — I708 Atherosclerosis of other arteries: Secondary | ICD-10-CM | POA: Diagnosis present

## 2014-07-18 DIAGNOSIS — C7931 Secondary malignant neoplasm of brain: Secondary | ICD-10-CM | POA: Insufficient documentation

## 2014-07-18 DIAGNOSIS — C3490 Malignant neoplasm of unspecified part of unspecified bronchus or lung: Secondary | ICD-10-CM | POA: Insufficient documentation

## 2014-07-18 DIAGNOSIS — Z85828 Personal history of other malignant neoplasm of skin: Secondary | ICD-10-CM | POA: Diagnosis present

## 2014-07-18 HISTORY — DX: Headache, unspecified: R51.9

## 2014-07-18 HISTORY — DX: Headache: R51

## 2014-07-18 HISTORY — DX: Anxiety disorder, unspecified: F41.9

## 2014-07-18 HISTORY — DX: Dizziness and giddiness: R42

## 2014-07-18 LAB — BASIC METABOLIC PANEL
ANION GAP: 12 (ref 5–15)
BUN: 40 mg/dL — ABNORMAL HIGH (ref 6–23)
CALCIUM: 9.1 mg/dL (ref 8.4–10.5)
CO2: 23 meq/L (ref 19–32)
CREATININE: 1.13 mg/dL (ref 0.50–1.35)
Chloride: 94 mEq/L — ABNORMAL LOW (ref 96–112)
GFR calc Af Amer: 76 mL/min — ABNORMAL LOW (ref >90)
GFR, EST NON AFRICAN AMERICAN: 66 mL/min — AB (ref >90)
Glucose, Bld: 438 mg/dL — ABNORMAL HIGH (ref 70–99)
POTASSIUM: 4.9 meq/L (ref 3.7–5.3)
SODIUM: 129 meq/L — AB (ref 137–147)

## 2014-07-18 LAB — ABO/RH: ABO/RH(D): AB POS

## 2014-07-18 LAB — CBC
HEMATOCRIT: 36.7 % — AB (ref 39.0–52.0)
Hemoglobin: 12.6 g/dL — ABNORMAL LOW (ref 13.0–17.0)
MCH: 30.8 pg (ref 26.0–34.0)
MCHC: 34.3 g/dL (ref 30.0–36.0)
MCV: 89.7 fL (ref 78.0–100.0)
Platelets: 202 10*3/uL (ref 150–400)
RBC: 4.09 MIL/uL — ABNORMAL LOW (ref 4.22–5.81)
RDW: 13.3 % (ref 11.5–15.5)
WBC: 17.5 10*3/uL — AB (ref 4.0–10.5)

## 2014-07-18 LAB — TYPE AND SCREEN
ABO/RH(D): AB POS
Antibody Screen: NEGATIVE

## 2014-07-20 NOTE — Progress Notes (Signed)
  Radiation Oncology         [phone] ________________________________  Stereotactic Treatment Procedure Note  Name: JJESUS DINGLEY MRN: [id number]  Date: 07/21/2014  DOB: [birth year]  SPECIAL TREATMENT PROCEDURE    ICD-9-CM ICD-10-CM   1. Brain metastasis 198.3 C79.31     3D TREATMENT PLANNING AND DOSIMETRY:  The patient's radiation plan was reviewed and approved by neurosurgery and radiation oncology prior to treatment.  It showed 3-dimensional radiation distributions overlaid onto the planning CT/MRI image set.  The [facility] for the target structures as well as the organs at risk were reviewed. The documentation of the 3D plan and dosimetry are filed in the radiation oncology EMR.  NARRATIVE:  TOMI PADDOCK was brought to the TrueBeam stereotactic radiation treatment machine and placed supine on the CT couch. The head frame was applied, and the patient was set up for stereotactic radiosurgery.  Neurosurgery was present for the set-up and delivery  SIMULATION VERIFICATION:  In the couch zero-angle position, the patient underwent Exactrac imaging using the Brainlab system with orthogonal KV images.  These were carefully aligned and repeated to confirm treatment position for each of the isocenters.  The Exactrac snap film verification was repeated at each couch angle.  SPECIAL TREATMENT PROCEDURE: Lajuana Carry received stereotactic radiosurgery to the following targets:  Right frontal 40 mm target and the Right parietal 30 mm targets were treated together using 7 DCA beams Beams to a prescription dose of 15 Gy and 18 Gy, respectively.  ExacTrac registration was performed for each couch angle.  The 100% isodose line was prescribed.  Right occipital 8 mm target was treated using 3 Dynamic Conformal Arcs to a prescription dose of 20 Gy.  ExacTrac registration was performed for each couch angle.  The 100% isodose line was prescribed.  STEREOTACTIC TREATMENT MANAGEMENT:  Following delivery,  the patient was transported to nursing in stable condition and monitored for possible acute effects.  Vital signs were recorded BP 133/69 mmHg  Pulse 67  Temp(Src) 97.6 F (36.4 C)  Ht 5\' 10"  (1.778 m). The patient tolerated treatment without significant acute effects, and was discharged to home in stable condition.    PLAN: Follow-up in one month.  ________________________________  Sheral Apley. Tammi Klippel, M.D.

## 2014-07-21 ENCOUNTER — Ambulatory Visit
Admission: RE | Admit: 2014-07-21 | Discharge: 2014-07-21 | Disposition: A | Discharge status: Home / Self Care | Payer: Medicare HMO | Source: Ambulatory Visit | Attending: Radiation Oncology | Admitting: Radiation Oncology

## 2014-07-21 ENCOUNTER — Encounter: Payer: Self-pay | Admitting: Radiation Oncology

## 2014-07-21 VITALS — BP 133/69 | HR 67 | Temp 97.6°F | Ht 70.0 in

## 2014-07-21 DIAGNOSIS — C7931 Secondary malignant neoplasm of brain: Secondary | ICD-10-CM

## 2014-07-21 DIAGNOSIS — Z510 Encounter for antineoplastic radiation therapy: Secondary | ICD-10-CM | POA: Diagnosis not present

## 2014-07-21 NOTE — Progress Notes (Signed)
  Name: Anthony Lopez  MRN: [id number]  Date: 07/21/2014   DOB: [birth year]  Stereotactic Radiosurgery Operative Note  PRE-OPERATIVE DIAGNOSIS:  Multiple Brain Metastases  POST-OPERATIVE DIAGNOSIS:  Multiple Brain Metastases  PROCEDURE:  Stereotactic Radiosurgery  SURGEON:  Eustace Moore, MD  NARRATIVE: The patient underwent a radiation treatment planning session in the radiation oncology simulation suite under the care of the radiation oncology physician and physicist.  I participated closely in the radiation treatment planning afterwards. The patient underwent planning CT which was fused to 3T high resolution MRI with 1 mm axial slices.  These images were fused on the planning system.  We contoured the gross target volumes and subsequently expanded this to yield the Planning Target Volume. I actively participated in the planning process.  I helped to define and review the target contours and also the contours of the optic pathway, eyes, brainstem and selected nearby organs at risk.  All the dose constraints for critical structures were reviewed and compared to AAPM Task Group 101.  The prescription dose conformity was reviewed.  I approved the plan electronically.    Accordingly, Anthony Lopez was brought to the TrueBeam stereotactic radiation treatment linac and placed in the custom immobilization mask.  The patient was aligned according to the IR fiducial markers with BrainLab Exactrac, then orthogonal x-rays were used in ExacTrac with the 6DOF robotic table and the shifts were made to align the patient  Anthony Lopez received stereotactic radiosurgery uneventfully.    Lesions treated:  3   Complex lesions treated:  1 (>3.5 cm, <52mm of optic path, or within the brainstem)   The detailed description of the procedure is recorded in the radiation oncology procedure note.  I was present for the duration of the procedure.  DISPOSITION:  Following delivery, the patient was transported to  nursing in stable condition and monitored for possible acute effects to be discharged to home in stable condition with follow-up in one month.  Eustace Moore, MD 07/21/2014 4:53 PM

## 2014-07-21 NOTE — Progress Notes (Addendum)
Mr. Smalls has completed SRS brain treatment today.  He denies any headache, n/v, nor blurred vision.  He reports that at times he feels lightheaded.  Currently on Decadron 4 mg po TID. Accompanied by family.  5;56pm No voiced concerns.  Seen and discharged by Dr. Tammi Klippel.  To have surgery on Wednesday of this week. Disposition to home with 3 family members

## 2014-07-21 NOTE — Progress Notes (Addendum)
Anesthesia Chart Review:  Patient is a 68 year old male scheduled for right craniotomy for tumor with curve on 07/23/14 by Dr. Ronnald Ramp.   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 PCP is Dr. Signa Kell, PA-C.  RAD-ONC Dr. Tammi Klippel. HEM-ONC Dr. Julien Nordmann. He was seen by cardiologist Dr. Ellyn Hack in 2014 for atypical chest pain with known left BBB and had a low risk stress test.  Meds: Fish Oil, Decadron, lisinopril.  EKG 07/03/14: SR, occasional PVCs, left BBB. He had a left BBB since at least 07/13/13 (low risk stress 08/06/13).  Nuclear stress test 08/06/13: Overall Impression: Low risk stress nuclear study Septal thinning c/w BBB artifact. LV Wall Motion: NL LV Function, EF 53%; NL Wall Motion.  Essentially normal echo in 2004.  1V CXR 07/07/14: Status post left lung biopsy. No left pneumothorax. Left lower lobe reticular nodular interstitial disease with a cavitary mass in the superior segment of the left lower lobe. The right lung is clear. There is no pneumothorax. Stable cardiomediastinal silhouette. Unremarkable osseous structures.  07/04/14 brain MRI: Three ring enhancing mass lesions in the right hemisphere involving the right frontal lobe, right parietal lobe and right occipital lobe.  Preoperative labs noted.  Na 129, BUN/CR 40/1.13. WBC 17.5 (up from 12.0 on 07/07/14. H/H 12.6/36.7.  Glucose 438. T&S done. (No reported history of DM; glucose range 07/04/14 - 07/07/14 was 157 - 209.  No A1C was done. He is now on steroid therapy which likely contributes to hyperglycemia.)  I discussed above with anesthesiologist Dr. Linna Caprice.  Due to the nature of patient's diagnosis and procedure needed and likely steroid induced (on at least contributing) hyperglycemia, would recommend that patient arrive  early (at 0830) in hopes that his glucose can be controlled prior to his ~ 1330 OR time.  I will order a CBG, but anticipate need for Glucomander.  The other option would be for Dr. Ronnald Ramp to have patient admitted the night before with Hospitalist consult.  I will contact his office tomorrow to discuss recommended options per anesthesia.  I will also alert one of the diabetic educators in hopes they can follow-up post-operatively.    George Hugh [facility] Short Stay Center/Anesthesiology Phone [phone] 07/21/2014 6:33 PM  Addendum: I notified Vanessa of glucose, Na, and WBC this morning and discussed plan for early arrival tomorrow versus admission tonight.  She reviewed with Dr. Ronnald Ramp who preferred early arrival tomorrow as recommended by Dr. Linna Caprice. Patient gave verbal permission to speak with his wife Curt Bears regarding hyperglycemia and earlier arrival time of 8:30 AM.  Reported fasting glucose this morning at [facility] was 311 and later 197.  She reports his A1C was "ok" in the past, but wasn't sure how long ago it was done.  I notified Sonia Baller with Diabetes Education so one of their nurses can follow-up with patient while he is hospitalized.  Dr. Ronnald Ramp may want to place a hospitalist consult as well. Patient will need further education regarding steroid induced hyperglycemia versus DM2 management and out-patient follow-up.  Plan for ISTAT4 (includes glucose) and CBC on arrival.  Treatment for pre-operative hyperglycemia per his anesthesiologist based on arrival results.  George Hugh [facility] Short Stay Center/Anesthesiology Phone [phone] 07/22/2014 1:14 PM

## 2014-07-22 ENCOUNTER — Ambulatory Visit (HOSPITAL_COMMUNITY)
Admission: RE | Admit: 2014-07-22 | Discharge: 2014-07-22 | Disposition: A | Discharge status: Home / Self Care | Payer: Medicare HMO | Source: Ambulatory Visit | Attending: Internal Medicine | Admitting: Internal Medicine

## 2014-07-22 ENCOUNTER — Encounter (HOSPITAL_COMMUNITY): Payer: Self-pay

## 2014-07-22 DIAGNOSIS — C3432 Malignant neoplasm of lower lobe, left bronchus or lung: Secondary | ICD-10-CM

## 2014-07-22 LAB — GLUCOSE, CAPILLARY
Glucose-Capillary: 297 mg/dL — ABNORMAL HIGH (ref 70–99)
Glucose-Capillary: 311 mg/dL — ABNORMAL HIGH (ref 70–99)

## 2014-07-22 MED ORDER — VANCOMYCIN HCL IN DEXTROSE 1-5 GM/200ML-% IV SOLN
1000.0000 mg | INTRAVENOUS | Status: AC
Start: 2014-07-23 — End: 2014-07-23
  Administered 2014-07-23: 1000 mg via INTRAVENOUS

## 2014-07-22 MED ORDER — FLUDEOXYGLUCOSE F - 18 (FDG) INJECTION
6.8000 | Freq: Once | INTRAVENOUS | Status: AC | PRN
  Administered 2014-07-22: 6.8 via INTRAVENOUS

## 2014-07-23 ENCOUNTER — Inpatient Hospital Stay (HOSPITAL_COMMUNITY): Payer: Medicare HMO | Admitting: Vascular Surgery

## 2014-07-23 ENCOUNTER — Encounter: Payer: Medicare HMO | Admitting: Nutrition

## 2014-07-23 ENCOUNTER — Inpatient Hospital Stay (HOSPITAL_COMMUNITY)
Admission: RE | Admit: 2014-07-23 | Discharge: 2014-07-26 | DRG: 026 | Disposition: A | Discharge status: Home Health | Payer: Medicare HMO | Source: Ambulatory Visit | Attending: Neurological Surgery | Admitting: Neurological Surgery

## 2014-07-23 ENCOUNTER — Inpatient Hospital Stay (HOSPITAL_COMMUNITY): Payer: Medicare HMO | Admitting: Anesthesiology

## 2014-07-23 ENCOUNTER — Encounter (HOSPITAL_COMMUNITY): Payer: Self-pay | Admitting: Surgery

## 2014-07-23 ENCOUNTER — Encounter (HOSPITAL_COMMUNITY)
Admission: RE | Disposition: A | Discharge status: Home Health | Payer: Self-pay | Source: Ambulatory Visit | Attending: Neurological Surgery

## 2014-07-23 DIAGNOSIS — I10 Essential (primary) hypertension: Secondary | ICD-10-CM | POA: Diagnosis present

## 2014-07-23 DIAGNOSIS — Z8673 Personal history of transient ischemic attack (TIA), and cerebral infarction without residual deficits: Secondary | ICD-10-CM | POA: Diagnosis not present

## 2014-07-23 DIAGNOSIS — Z880 Allergy status to penicillin: Secondary | ICD-10-CM

## 2014-07-23 DIAGNOSIS — R51 Headache: Secondary | ICD-10-CM | POA: Diagnosis present

## 2014-07-23 DIAGNOSIS — Z9889 Other specified postprocedural states: Secondary | ICD-10-CM

## 2014-07-23 DIAGNOSIS — C7931 Secondary malignant neoplasm of brain: Principal | ICD-10-CM | POA: Diagnosis present

## 2014-07-23 DIAGNOSIS — C3490 Malignant neoplasm of unspecified part of unspecified bronchus or lung: Secondary | ICD-10-CM | POA: Diagnosis present

## 2014-07-23 DIAGNOSIS — Z8249 Family history of ischemic heart disease and other diseases of the circulatory system: Secondary | ICD-10-CM

## 2014-07-23 DIAGNOSIS — F17210 Nicotine dependence, cigarettes, uncomplicated: Secondary | ICD-10-CM | POA: Diagnosis present

## 2014-07-23 DIAGNOSIS — Z881 Allergy status to other antibiotic agents status: Secondary | ICD-10-CM

## 2014-07-23 DIAGNOSIS — R42 Dizziness and giddiness: Secondary | ICD-10-CM | POA: Diagnosis present

## 2014-07-23 DIAGNOSIS — F419 Anxiety disorder, unspecified: Secondary | ICD-10-CM | POA: Diagnosis present

## 2014-07-23 DIAGNOSIS — Z79899 Other long term (current) drug therapy: Secondary | ICD-10-CM

## 2014-07-23 DIAGNOSIS — D496 Neoplasm of unspecified behavior of brain: Secondary | ICD-10-CM

## 2014-07-23 HISTORY — PX: CRANIOTOMY: SHX93

## 2014-07-23 LAB — CBC
HEMATOCRIT: 38.8 % — AB (ref 39.0–52.0)
Hemoglobin: 13.6 g/dL (ref 13.0–17.0)
MCH: 31.3 pg (ref 26.0–34.0)
MCHC: 35.1 g/dL (ref 30.0–36.0)
MCV: 89.2 fL (ref 78.0–100.0)
Platelets: 170 10*3/uL (ref 150–400)
RBC: 4.35 MIL/uL (ref 4.22–5.81)
RDW: 13.5 % (ref 11.5–15.5)
WBC: 16.5 10*3/uL — AB (ref 4.0–10.5)

## 2014-07-23 LAB — GLUCOSE, CAPILLARY: GLUCOSE-CAPILLARY: 221 mg/dL — AB (ref 70–99)

## 2014-07-23 LAB — POCT I-STAT 4, (NA,K, GLUC, HGB,HCT)
Glucose, Bld: 225 mg/dL — ABNORMAL HIGH (ref 70–99)
HEMATOCRIT: 42 % (ref 39.0–52.0)
Hemoglobin: 14.3 g/dL (ref 13.0–17.0)
Potassium: 5.2 mEq/L (ref 3.7–5.3)
Sodium: 129 mEq/L — ABNORMAL LOW (ref 137–147)

## 2014-07-23 SURGERY — CRANIOTOMY TUMOR EXCISION
Anesthesia: General | Site: Head | Laterality: Right

## 2014-07-23 MED ORDER — PROPOFOL 10 MG/ML IV BOLUS
INTRAVENOUS | Status: AC
  Filled 2014-07-23: qty 20

## 2014-07-23 MED ORDER — ESMOLOL HCL 10 MG/ML IV SOLN
INTRAVENOUS | Status: AC
  Filled 2014-07-23: qty 10

## 2014-07-23 MED ORDER — SODIUM CHLORIDE 0.9 % IR SOLN
Status: DC | PRN
  Administered 2014-07-23: 17:00:00

## 2014-07-23 MED ORDER — LABETALOL HCL 5 MG/ML IV SOLN: 10.0000 mg | INTRAVENOUS | Status: DC | PRN

## 2014-07-23 MED ORDER — ONDANSETRON HCL 4 MG/2ML IJ SOLN
INTRAMUSCULAR | Status: AC
  Filled 2014-07-23: qty 2

## 2014-07-23 MED ORDER — ACETAMINOPHEN 650 MG RE SUPP: 650.0000 mg | RECTAL | Status: DC | PRN

## 2014-07-23 MED ORDER — SODIUM CHLORIDE 0.9 % IV SOLN
INTRAVENOUS | Status: DC | PRN
  Administered 2014-07-23: 16:00:00 via INTRAVENOUS

## 2014-07-23 MED ORDER — ROCURONIUM BROMIDE 100 MG/10ML IV SOLN
INTRAVENOUS | Status: DC | PRN
  Administered 2014-07-23: 50 mg via INTRAVENOUS

## 2014-07-23 MED ORDER — VECURONIUM BROMIDE 10 MG IV SOLR
INTRAVENOUS | Status: DC | PRN
  Administered 2014-07-23: 2 mg via INTRAVENOUS
  Administered 2014-07-23: 1 mg via INTRAVENOUS

## 2014-07-23 MED ORDER — ONDANSETRON HCL 4 MG/2ML IJ SOLN: 4.0000 mg | INTRAMUSCULAR | Status: DC | PRN

## 2014-07-23 MED ORDER — PROMETHAZINE HCL 25 MG PO TABS: 12.5000 mg | ORAL_TABLET | ORAL | Status: DC | PRN

## 2014-07-23 MED ORDER — GLYCOPYRROLATE 0.2 MG/ML IJ SOLN
INTRAMUSCULAR | Status: AC
  Filled 2014-07-23: qty 2

## 2014-07-23 MED ORDER — THROMBIN 5000 UNITS EX SOLR
OROMUCOSAL | Status: DC | PRN
  Administered 2014-07-23: 17:00:00 via TOPICAL

## 2014-07-23 MED ORDER — HYDROMORPHONE HCL 1 MG/ML IJ SOLN: 0.2500 mg | INTRAMUSCULAR | Status: DC | PRN

## 2014-07-23 MED ORDER — LISINOPRIL 10 MG PO TABS
10.0000 mg | ORAL_TABLET | Freq: Every day | ORAL | Status: DC
  Administered 2014-07-24 – 2014-07-26 (×3): 10 mg via ORAL
  Filled 2014-07-23 (×4): qty 1

## 2014-07-23 MED ORDER — PROMETHAZINE HCL 25 MG/ML IJ SOLN: 6.2500 mg | INTRAMUSCULAR | Status: DC | PRN

## 2014-07-23 MED ORDER — NEOSTIGMINE METHYLSULFATE 10 MG/10ML IV SOLN
INTRAVENOUS | Status: DC | PRN
  Administered 2014-07-23: 3 mg via INTRAVENOUS

## 2014-07-23 MED ORDER — OXYCODONE HCL 5 MG/5ML PO SOLN: 5.0000 mg | Freq: Once | ORAL | Status: DC | PRN

## 2014-07-23 MED ORDER — PROPOFOL 10 MG/ML IV BOLUS
INTRAVENOUS | Status: DC | PRN
  Administered 2014-07-23: 20 mg via INTRAVENOUS
  Administered 2014-07-23: 100 mg via INTRAVENOUS
  Administered 2014-07-23: 20 mg via INTRAVENOUS

## 2014-07-23 MED ORDER — VANCOMYCIN HCL IN DEXTROSE 1-5 GM/200ML-% IV SOLN
INTRAVENOUS | Status: AC
  Filled 2014-07-23: qty 200

## 2014-07-23 MED ORDER — ROCURONIUM BROMIDE 50 MG/5ML IV SOLN
INTRAVENOUS | Status: AC
  Filled 2014-07-23: qty 1

## 2014-07-23 MED ORDER — LIDOCAINE-EPINEPHRINE 1 %-1:100000 IJ SOLN
INTRAMUSCULAR | Status: DC | PRN
  Administered 2014-07-23: 10 mL

## 2014-07-23 MED ORDER — ONDANSETRON HCL 4 MG PO TABS: 4.0000 mg | ORAL_TABLET | ORAL | Status: DC | PRN

## 2014-07-23 MED ORDER — PHENYLEPHRINE HCL 10 MG/ML IJ SOLN
INTRAMUSCULAR | Status: DC | PRN
  Administered 2014-07-23 (×2): 40 ug via INTRAVENOUS

## 2014-07-23 MED ORDER — OXYCODONE HCL 5 MG PO TABS: 5.0000 mg | ORAL_TABLET | Freq: Once | ORAL | Status: DC | PRN

## 2014-07-23 MED ORDER — MORPHINE SULFATE 2 MG/ML IJ SOLN
1.0000 mg | INTRAMUSCULAR | Status: DC | PRN
  Administered 2014-07-23: 2 mg via INTRAVENOUS
  Filled 2014-07-23: qty 1

## 2014-07-23 MED ORDER — SURGIFOAM 100 EX MISC
CUTANEOUS | Status: DC | PRN
  Administered 2014-07-23: 17:00:00 via TOPICAL

## 2014-07-23 MED ORDER — HYDROCODONE-ACETAMINOPHEN 5-325 MG PO TABS
1.0000 | ORAL_TABLET | ORAL | Status: DC | PRN
  Administered 2014-07-23 – 2014-07-25 (×3): 1 via ORAL
  Filled 2014-07-23 (×3): qty 1

## 2014-07-23 MED ORDER — ACETAMINOPHEN 325 MG PO TABS
650.0000 mg | ORAL_TABLET | ORAL | Status: DC | PRN
Start: 2014-07-23 — End: 2014-07-26

## 2014-07-23 MED ORDER — VANCOMYCIN HCL IN DEXTROSE 750-5 MG/150ML-% IV SOLN
750.0000 mg | Freq: Two times a day (BID) | INTRAVENOUS | Status: DC
  Administered 2014-07-24: 750 mg via INTRAVENOUS
  Filled 2014-07-23 (×2): qty 150

## 2014-07-23 MED ORDER — ARTIFICIAL TEARS OP OINT
TOPICAL_OINTMENT | OPHTHALMIC | Status: DC | PRN
  Administered 2014-07-23: 1 via OPHTHALMIC

## 2014-07-23 MED ORDER — ONDANSETRON HCL 4 MG/2ML IJ SOLN
INTRAMUSCULAR | Status: DC | PRN
  Administered 2014-07-23: 4 mg via INTRAVENOUS

## 2014-07-23 MED ORDER — SENNA 8.6 MG PO TABS
1.0000 | ORAL_TABLET | Freq: Two times a day (BID) | ORAL | Status: DC
  Administered 2014-07-24 – 2014-07-26 (×4): 8.6 mg via ORAL
  Filled 2014-07-23 (×7): qty 1

## 2014-07-23 MED ORDER — 0.9 % SODIUM CHLORIDE (POUR BTL) OPTIME
TOPICAL | Status: DC | PRN
  Administered 2014-07-23 (×2): 1000 mL

## 2014-07-23 MED ORDER — PANTOPRAZOLE SODIUM 40 MG IV SOLR
40.0000 mg | Freq: Every day | INTRAVENOUS | Status: DC
  Filled 2014-07-23 (×2): qty 40

## 2014-07-23 MED ORDER — LEVETIRACETAM IN NACL 500 MG/100ML IV SOLN
500.0000 mg | Freq: Two times a day (BID) | INTRAVENOUS | Status: DC
  Administered 2014-07-23 – 2014-07-24 (×2): 500 mg via INTRAVENOUS
  Filled 2014-07-23 (×3): qty 100

## 2014-07-23 MED ORDER — HEMOSTATIC AGENTS (NO CHARGE) OPTIME
TOPICAL | Status: DC | PRN
  Administered 2014-07-23: 1 via TOPICAL

## 2014-07-23 MED ORDER — POTASSIUM CHLORIDE IN NACL 20-0.9 MEQ/L-% IV SOLN
INTRAVENOUS | Status: DC
  Administered 2014-07-23: 21:00:00 via INTRAVENOUS
  Filled 2014-07-23 (×5): qty 1000

## 2014-07-23 MED ORDER — FENTANYL CITRATE 0.05 MG/ML IJ SOLN
INTRAMUSCULAR | Status: DC | PRN
  Administered 2014-07-23: 50 ug via INTRAVENOUS
  Administered 2014-07-23: 75 ug via INTRAVENOUS
  Administered 2014-07-23: 50 ug via INTRAVENOUS
  Administered 2014-07-23: 75 ug via INTRAVENOUS

## 2014-07-23 MED ORDER — ARTIFICIAL TEARS OP OINT
TOPICAL_OINTMENT | OPHTHALMIC | Status: AC
  Filled 2014-07-23: qty 3.5

## 2014-07-23 MED ORDER — LACTATED RINGERS IV SOLN
INTRAVENOUS | Status: DC
  Administered 2014-07-23: 10:00:00 via INTRAVENOUS

## 2014-07-23 MED ORDER — GLYCOPYRROLATE 0.2 MG/ML IJ SOLN
INTRAMUSCULAR | Status: DC | PRN
  Administered 2014-07-23: 0.6 mg via INTRAVENOUS

## 2014-07-23 MED ORDER — BACITRACIN ZINC 500 UNIT/GM EX OINT
TOPICAL_OINTMENT | CUTANEOUS | Status: DC | PRN
  Administered 2014-07-23: 1 via TOPICAL

## 2014-07-23 MED ORDER — DEXTROSE 5 % IV SOLN
10.0000 mg | INTRAVENOUS | Status: DC | PRN
  Administered 2014-07-23: 15 ug/min via INTRAVENOUS

## 2014-07-23 MED ORDER — FENTANYL CITRATE 0.05 MG/ML IJ SOLN
INTRAMUSCULAR | Status: AC
  Filled 2014-07-23: qty 5

## 2014-07-23 MED ORDER — LIDOCAINE HCL (CARDIAC) 20 MG/ML IV SOLN
INTRAVENOUS | Status: DC | PRN
  Administered 2014-07-23: 80 mg via INTRAVENOUS

## 2014-07-23 SURGICAL SUPPLY — 64 items
BAG DECANTER FOR FLEXI CONT (MISCELLANEOUS) ×3 IMPLANT
BLADE CLIPPER SURG NEURO (BLADE) ×3 IMPLANT
BRUSH SCRUB EZ 1% IODOPHOR (MISCELLANEOUS) IMPLANT
BRUSH SCRUB EZ PLAIN DRY (MISCELLANEOUS) ×3 IMPLANT
BUR ROUTER D-58 CRANI (BURR) ×3 IMPLANT
CANISTER SUCT 3000ML (MISCELLANEOUS) ×3 IMPLANT
CLIP TI MEDIUM 6 (CLIP) ×3 IMPLANT
CONT SPEC 4OZ CLIKSEAL STRL BL (MISCELLANEOUS) ×6 IMPLANT
COVER BACK TABLE 60X90IN (DRAPES) IMPLANT
DRAPE MICROSCOPE LEICA (MISCELLANEOUS) IMPLANT
DRAPE NEUROLOGICAL W/INCISE (DRAPES) ×3 IMPLANT
DRAPE SURG 17X23 STRL (DRAPES) IMPLANT
DRAPE WARM FLUID 44X44 (DRAPE) ×3 IMPLANT
DRSG TELFA 3X8 NADH (GAUZE/BANDAGES/DRESSINGS) IMPLANT
DURAPREP 6ML APPLICATOR 50/CS (WOUND CARE) ×3 IMPLANT
ELECT REM PT RETURN 9FT ADLT (ELECTROSURGICAL) ×3
ELECTRODE REM PT RTRN 9FT ADLT (ELECTROSURGICAL) ×1 IMPLANT
EVACUATOR 1/8 PVC DRAIN (DRAIN) IMPLANT
GAUZE SPONGE 4X4 12PLY STRL (GAUZE/BANDAGES/DRESSINGS) ×3 IMPLANT
GAUZE SPONGE 4X4 16PLY XRAY LF (GAUZE/BANDAGES/DRESSINGS) IMPLANT
GLOVE BIO SURGEON STRL SZ 6.5 (GLOVE) ×4 IMPLANT
GLOVE BIO SURGEON STRL SZ8 (GLOVE) ×3 IMPLANT
GLOVE BIO SURGEONS STRL SZ 6.5 (GLOVE) ×2
GLOVE BIOGEL PI IND STRL 6.5 (GLOVE) ×1 IMPLANT
GLOVE BIOGEL PI INDICATOR 6.5 (GLOVE) ×2
GOWN STRL REUS W/ TWL LRG LVL3 (GOWN DISPOSABLE) ×1 IMPLANT
GOWN STRL REUS W/ TWL XL LVL3 (GOWN DISPOSABLE) ×1 IMPLANT
GOWN STRL REUS W/TWL 2XL LVL3 (GOWN DISPOSABLE) IMPLANT
GOWN STRL REUS W/TWL LRG LVL3 (GOWN DISPOSABLE) ×2
GOWN STRL REUS W/TWL XL LVL3 (GOWN DISPOSABLE) ×2
HEMOSTAT POWDER KIT SURGIFOAM (HEMOSTASIS) ×3 IMPLANT
HEMOSTAT SURGICEL 2X14 (HEMOSTASIS) ×3 IMPLANT
KIT BASIN OR (CUSTOM PROCEDURE TRAY) ×3 IMPLANT
KIT ROOM TURNOVER OR (KITS) ×3 IMPLANT
NEEDLE HYPO 22GX1.5 SAFETY (NEEDLE) ×3 IMPLANT
NS IRRIG 1000ML POUR BTL (IV SOLUTION) ×3 IMPLANT
PACK CRANIOTOMY (CUSTOM PROCEDURE TRAY) ×3 IMPLANT
PAD ARMBOARD 7.5X6 YLW CONV (MISCELLANEOUS) ×6 IMPLANT
PATTIES SURGICAL .25X.25 (GAUZE/BANDAGES/DRESSINGS) IMPLANT
PATTIES SURGICAL .5 X.5 (GAUZE/BANDAGES/DRESSINGS) IMPLANT
PATTIES SURGICAL .5 X3 (DISPOSABLE) IMPLANT
PATTIES SURGICAL 1X1 (DISPOSABLE) IMPLANT
PERFORATOR LRG  14-11MM (BIT) ×2
PERFORATOR LRG 14-11MM (BIT) ×1 IMPLANT
PIN MAYFIELD SKULL DISP (PIN) IMPLANT
PLATE 1.5  2HOLE LNG NEURO (Plate) ×6 IMPLANT
PLATE 1.5 2HOLE LNG NEURO (Plate) ×3 IMPLANT
RUBBERBAND STERILE (MISCELLANEOUS) IMPLANT
SCREW SELF DRILL HT 1.5/4MM (Screw) ×18 IMPLANT
SPECIMEN JAR SMALL (MISCELLANEOUS) IMPLANT
SPONGE NEURO XRAY DETECT 1X3 (DISPOSABLE) IMPLANT
SPONGE SURGIFOAM ABS GEL 100 (HEMOSTASIS) ×3 IMPLANT
STAPLER VISISTAT 35W (STAPLE) ×6 IMPLANT
SUT ETHILON 3 0 FSL (SUTURE) IMPLANT
SUT NURALON 4 0 TR CR/8 (SUTURE) ×6 IMPLANT
SUT VIC AB 2-0 CP2 18 (SUTURE) ×9 IMPLANT
SYR CONTROL 10ML LL (SYRINGE) ×3 IMPLANT
TAPE CLOTH SURG 4X10 WHT LF (GAUZE/BANDAGES/DRESSINGS) ×3 IMPLANT
TOWEL OR 17X24 6PK STRL BLUE (TOWEL DISPOSABLE) ×3 IMPLANT
TOWEL OR 17X26 10 PK STRL BLUE (TOWEL DISPOSABLE) ×3 IMPLANT
TRAY FOLEY CATH 14FRSI W/METER (CATHETERS) IMPLANT
TRAY FOLEY CATH 16FRSI W/METER (SET/KITS/TRAYS/PACK) ×3 IMPLANT
UNDERPAD 30X30 INCONTINENT (UNDERPADS AND DIAPERS) IMPLANT
WATER STERILE IRR 1000ML POUR (IV SOLUTION) ×3 IMPLANT

## 2014-07-23 NOTE — Transfer of Care (Signed)
Immediate Anesthesia Transfer of Care Note  Patient: Anthony Lopez  Procedure(s) Performed: Procedure(s) with comments: RIGHT CRANIOTOMY FOR TUMOR WITH CURVE (Right) - RIGHT CRANIOTOMY FOR TUMOR WITH CURVE  Patient Location: PACU  Anesthesia Type:General  Level of Consciousness: awake, alert  and oriented  Airway & Oxygen Therapy: Patient Spontanous Breathing and Patient connected to nasal cannula oxygen  Post-op Assessment: Report given to PACU RN, Post -op Vital signs reviewed and stable and Patient moving all extremities X 4  Post vital signs: Reviewed and stable  Complications: No apparent anesthesia complications

## 2014-07-23 NOTE — Anesthesia Preprocedure Evaluation (Addendum)
Anesthesia Evaluation  Patient identified by MRN, date of birth, ID band Patient awake    Reviewed: Allergy & Precautions, H&P , NPO status , Patient's Chart, lab work & pertinent test results  Airway        Dental   Pulmonary Current Smoker,  Lung ca c mets to brain, has had hemoptysis in occasion. Has been very heavy smoker breath sounds clear to auscultation        Cardiovascular hypertension, + Peripheral Vascular Disease + dysrhythmias Rhythm:Regular Rate:Normal  Severe occlussive disease noted on CT.   Neuro/Psych CVA    GI/Hepatic negative GI ROS, Neg liver ROS,   Endo/Other  Elevated glucose due to steroid s for brain swelling  Renal/GU      Musculoskeletal   Abdominal   Peds  Hematology   Anesthesia Other Findings   Reproductive/Obstetrics                            Anesthesia Physical Anesthesia Plan  ASA: III  Anesthesia Plan: General   Post-op Pain Management:    Induction: Intravenous  Airway Management Planned: Oral ETT  Additional Equipment: Arterial line  Intra-op Plan:   Post-operative Plan: Extubation in OR and Possible Post-op intubation/ventilation  Informed Consent: I have reviewed the patients History and Physical, chart, labs and discussed the procedure including the risks, benefits and alternatives for the proposed anesthesia with the patient or authorized representative who has indicated his/her understanding and acceptance.   Dental advisory given  Plan Discussed with:   Anesthesia Plan Comments:         Anesthesia Quick Evaluation

## 2014-07-23 NOTE — Op Note (Signed)
07/23/2014  5:31 PM  PATIENT:  Anthony Lopez  68 y.o. male  PRE-OPERATIVE DIAGNOSIS:  Right frontal brain metastasis  POST-OPERATIVE DIAGNOSIS:  same  PROCEDURE:  Right frontal craniotomy for resection of brain metastasis utilizing frameless stereotactic navigation  SURGEON:  Sherley Bounds, MD  ASSISTANTS: Dr. Joya Salm  ANESTHESIA:   General  EBL: 50 ml  Total I/O In: -  Out: 325 [Urine:275; Blood:50]  BLOOD ADMINISTERED:none  DRAINS: None   SPECIMEN:  Biopsy / Limited Resection  INDICATION FOR PROCEDURE: This patient presented with difficulty with his left side and headaches. He was found to have multiple brain metastasis. He was found to have a lung primary. He underwent stereotactic radiosurgery to 3 lesions on Monday and presents today for planned right frontal craniotomy for resection of tumor. Patient understood the risks, benefits, and alternatives and potential outcomes and wished to proceed.  PROCEDURE DETAILS: The patient was taken to the operating room and after induction of adequate generalized endotracheal anesthesia, the head was affixed in a 3 point Mayfield head rest, and turned to the left to expose the right frontotemporal parietal region. We registered our curve, and planned the incision. The head was shaved and then cleaned and then prepped with DuraPrep and draped in the usual sterile fashion. 10 cc of local anesthetic was injected, and a right frontal incision was made on the right of the head. Raney clips were placed to establish hemostasis of the scalp, the muscle was reflected with the scalp flap, to expose the right frontal region. A burr hole was placed, and a craniotomy flap was turned utilizing the high-speed, air poweedr drill. The flap was then placed in bacitracin-containing saline solution, and the dura was opened to expose the right frontal region. A small corticectomy was created with the bipolar forceps, and the underlying tumor was then identified  and there was initial release of green cystic fluid. The tumor was then removed by suction and bipolar cautery. Several pieces were removed and sent for pathology. I continued to remove tumor until the capsule was gone and I identified only edematous brain around the margins. Once the tumor was removed I dried the surgical bed with bipolar cautery and  Surgifoam, checked the resection cavity with our frameless stereotactic navigation to assure that we had gotten the tumor margins and then lined the surgical bed with Surgicel. I then placed a subdural drain through separate stab incision and closed the dura with a running 4-0 Nurolon suture. The dura was lined with Gelfoam, and the craniotomy flap was replaced with doggie-bone plates. The wound was copiously irrigated.  the galea was then closed with interrupted 2-0 Vicryl suture. The skin was then closed with staples a sterile dressing was applied. The patient was then taken out of the 3-point Mayfield headrest and awakened from general anesthesia, and transported to the recovery room in stable condition. At the end of the procedure all sponge, needle, and instrument counts were correct.    PLAN OF CARE: Admit to inpatient   PATIENT DISPOSITION:  PACU - hemodynamically stable.   Delay start of Pharmacological VTE agent (>24hrs) due to surgical blood loss or risk of bleeding:  yes

## 2014-07-23 NOTE — Anesthesia Procedure Notes (Addendum)
Procedure Name: Intubation Date/Time: 07/23/2014 3:45 PM Performed by: Trixie Deis A Pre-anesthesia Checklist: Patient identified, Timeout performed, Emergency Drugs available, Suction available and Patient being monitored Patient Re-evaluated:Patient Re-evaluated prior to inductionOxygen Delivery Method: Circle system utilized Preoxygenation: Pre-oxygenation with 100% oxygen Intubation Type: IV induction Ventilation: Oral airway inserted - appropriate to patient size and Mask ventilation without difficulty Laryngoscope Size: Mac and 4 Grade View: Grade I Tube type: Oral Tube size: 7.5 mm Number of attempts: 1 Airway Equipment and Method: Stylet and LTA kit utilized Placement Confirmation: ETT inserted through vocal cords under direct vision,  positive ETCO2,  CO2 detector and breath sounds checked- equal and bilateral Secured at: 23 cm Tube secured with: Tape Dental Injury: Teeth and Oropharynx as per pre-operative assessment  Comments: Intubation by Oneal Deputy under supervision of CRNA and MDA

## 2014-07-23 NOTE — Progress Notes (Signed)
Nurse called Dr. Orene Desanctis (Anesthesia) and informed him of patients blood glucose and sodium level. No orders given. Will continue to monitor patient.

## 2014-07-23 NOTE — Progress Notes (Addendum)
ANTIBIOTIC CONSULT NOTE - INITIAL  Pharmacy Consult for vancomycin Indication: surgical prophylaxis  Allergies  Allergen Reactions  . Doxycycline     Stay asleep   . Penicillins Rash    Childhood reaction    Patient Measurements: Height: 5\' 10"  (177.8 cm) Weight: 137 lb 2 oz (62.2 kg) IBW/kg (Calculated) : 73 Vital Signs: Temp: 97.8 F (36.6 C) (12/16 1800) Temp Source: Oral (12/16 0917) BP: 128/65 mmHg (12/16 1823) Pulse Rate: 50 (12/16 1826) Intake/Output from previous day:   Intake/Output from this shift: Total I/O In: -  Out: 325 [Urine:275; Blood:50]  Labs:  Recent Labs  07/23/14 1012 07/23/14 1026  WBC  --  16.5*  HGB 14.3 13.6  PLT  --  170   Estimated Creatinine Clearance: 56.6 mL/min (by C-G formula based on Cr of 1.13). No results for input(s): VANCOTROUGH, VANCOPEAK, VANCORANDOM, GENTTROUGH, GENTPEAK, GENTRANDOM, TOBRATROUGH, TOBRAPEAK, TOBRARND, AMIKACINPEAK, AMIKACINTROU, AMIKACIN in the last 72 hours.   Microbiology: Recent Results (from the past 720 hour(s))  MRSA PCR Screening     Status: None   Collection Time: 07/03/14  9:52 PM  Result Value Ref Range Status   MRSA by PCR NEGATIVE NEGATIVE Final    Comment:        The GeneXpert MRSA Assay (FDA approved for NASAL specimens only), is one component of a comprehensive MRSA colonization surveillance program. It is not intended to diagnose MRSA infection nor to guide or monitor treatment for MRSA infections.     Medical History: Past Medical History  Diagnosis Date  . CVA (cerebral infarction) 2000  . HTN (hypertension), benign   . Rheumatic fever     Age 20 or 39  . Tobacco abuse   . Lung cancer   . Brain cancer   . Anxiety   . Headache   . Dizziness     Medications:  Anti-infectives    Start     Dose/Rate Route Frequency Ordered Stop   07/23/14 1659  bacitracin 50,000 Units in sodium chloride irrigation 0.9 % 500 mL irrigation  Status:  Discontinued       As needed  07/23/14 1659 07/23/14 1733   07/23/14 1455  vancomycin (VANCOCIN) 1 GM/200ML IVPB    Comments:  Trixie Deis   : cabinet override      07/23/14 1455 07/24/14 0259   07/23/14 0600  vancomycin (VANCOCIN) IVPB 1000 mg/200 mL premix     1,000 mg200 mL/hr over 60 Minutes Intravenous On call to O.R. 07/22/14 1357 07/23/14 1633     Assessment: 68 year old male status post R-frontal craniotomy for resection of brain metastasis to receive vancomycin for surgical prophylaxis x24 hours.   ID: WBC 16.5. Patient is currently afebrile. Last SCr on 12/11 was 1.13 with estimated CrCl ~ 55 mL/min. Pre-op vancomycin was given at 15:33PM.   Goal of Therapy:  Vancomycin trough level 15-20 mcg/ml   Plan:  Vancomycin 750mg  IV every 12 hours x 24 hours.  Follow-up clinical status and renal function.    Sloan Leiter, PharmD, BCPS Clinical Pharmacist [phone] 07/23/2014,6:31 PM

## 2014-07-23 NOTE — H&P (Signed)
Subjective: Patient is a 68 y.o. male admitted for craniotomy for tumor. Onset of symptoms was a few weeks ago, unchanged since that time.  The pain is rated mild . He denies significant weakness. He was found to have multiple brain masses, with lung primary. He underwent stereotactic radiosurgery to the lesions 2 days ago.[facility] MRI or CT showed multiple brain masses with a dominant right frontal mass. He presents today for resection of the right frontal tumor   Past Medical History  Diagnosis Date  . CVA (cerebral infarction) 2000  . HTN (hypertension), benign   . Rheumatic fever     Age 43 or 84  . Tobacco abuse   . Lung cancer   . Brain cancer   . Anxiety   . Headache   . Dizziness     Past Surgical History  Procedure Laterality Date  . Transthoracic echocardiogram  January 2004    Normal LV size and function. EF 55-65%.  . Carotid dopplers Bilateral 07/18/2013    Right subclavian 0-49%, left subclavian mild bilateral carotid stenosis..  . 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   . Mohs surgery      h/o NMSC (scc or bcc)  . Nose surgery      cancer removed  . Hernia repair      Prior to Admission medications   Medication Sig Start Date End Date Taking? Authorizing Provider  dexamethasone (DECADRON) 4 MG tablet Take 1 tablet (4 mg total) by mouth 3 (three) times daily. 07/07/14  Yes Tasrif Ahmed, MD  lisinopril (PRINIVIL,ZESTRIL) 10 MG tablet Take 10 mg by mouth daily.   Yes Historical Provider, MD  Omega-3 Fatty Acids (FISH OIL) 1000 MG CAPS Take by mouth.   Yes Historical Provider, MD   Allergies  Allergen Reactions  . Doxycycline     Stay asleep   . Penicillins Rash    Childhood reaction    History  Substance Use Topics  . Smoking status: Current Every Day Smoker -- 1.00 packs/day for 50 years    Types: Cigarettes  . Smokeless tobacco: Never Used     Comment:  Smokes < a pack per day now.  . Alcohol Use: No    Family History  Problem Relation Age of Onset  . Diabetes Mother   . Diabetes Father   . Heart attack Father   . Skin cancer Mother   . Hypertension Daughter      Review of Systems  Positive ROS: neg  All other systems have been reviewed and were otherwise negative with the exception of those mentioned in the HPI and as above.  Objective: Vital signs in last 24 hours: Temp:  [97.5 F (36.4 C)] 97.5 F (36.4 C) (12/16 0917) Pulse Rate:  [105] 105 (12/16 0917) Resp:  [18] 18 (12/16 0917) BP: (161)/(79) 161/79 mmHg (12/16 0917) SpO2:  [96 %] 96 % (12/16 0917) Weight:  [132 lb (59.875 kg)] 132 lb (59.875 kg) (12/16 0917)  General Appearance: Alert, cooperative, no distress, appears stated age Head: Normocephalic, without obvious abnormality, atraumatic Eyes: PERRL, conjunctiva/corneas clear, EOM's intact    Neck: Supple, symmetrical, trachea midline Back: Symmetric, no curvature, ROM normal, no CVA tenderness Lungs:  respirations unlabored Heart: Regular rate and rhythm Abdomen: Soft, non-tender Extremities: Extremities normal, atraumatic, no cyanosis or edema Pulses: 2+ and symmetric all extremities Skin: Skin color, texture, turgor normal, no rashes or  lesions  NEUROLOGIC:   Mental status: Alert and oriented x4,  no aphasia, good attention span, fund of knowledge, and memory Motor Exam - grossly normal Sensory Exam - grossly normal Reflexes: 1+ Coordination - grossly normal Gait - not tested Balance - not tested Cranial Nerves: I: smell Not tested  II: visual acuity  OS: nl    OD: nl  II: visual fields Full to confrontation  II: pupils Equal, round, reactive to light  III,VII: ptosis None  III,IV,VI: extraocular muscles  Full ROM  V: mastication Normal  V: facial light touch sensation  Normal  V,VII: corneal reflex  Present  VII: facial muscle function - upper  Normal  VII: facial muscle function - lower  Normal  VIII: hearing Not tested  IX: soft palate elevation  Normal  IX,X: gag reflex Present  XI: trapezius strength  5/5  XI: sternocleidomastoid strength 5/5  XI: neck flexion strength  5/5  XII: tongue strength  Normal    Data Review Lab Results  Component Value Date   WBC 16.5* 07/23/2014   HGB 13.6 07/23/2014   HCT 38.8* 07/23/2014   MCV 89.2 07/23/2014   PLT 170 07/23/2014   Lab Results  Component Value Date   NA 129* 07/23/2014   K 5.2 07/23/2014   CL 94* 07/18/2014   CO2 23 07/18/2014   BUN 40* 07/18/2014   CREATININE 1.13 07/18/2014   GLUCOSE 225* 07/23/2014   Lab Results  Component Value Date   INR 1.07 07/06/2014    Assessment/Plan: Patient admitted for right frontal craniotomy for tumor. Patient has failed a reasonable attempt at conservative therapy.  I explained the condition and procedure to the patient and answered any questions.  Patient wishes to proceed with procedure as planned. Understands risks/ benefits and typical outcomes of procedure.   JONES,DAVID S 07/23/2014 2:37 PM

## 2014-07-24 ENCOUNTER — Ambulatory Visit: Payer: Medicare HMO | Admitting: Internal Medicine

## 2014-07-24 ENCOUNTER — Other Ambulatory Visit: Payer: Medicare HMO

## 2014-07-24 ENCOUNTER — Inpatient Hospital Stay (HOSPITAL_COMMUNITY): Payer: Medicare HMO

## 2014-07-24 LAB — POCT I-STAT 4, (NA,K, GLUC, HGB,HCT)
GLUCOSE: 166 mg/dL — AB (ref 70–99)
HEMATOCRIT: 33 % — AB (ref 39.0–52.0)
Hemoglobin: 11.2 g/dL — ABNORMAL LOW (ref 13.0–17.0)
Potassium: 4.2 mEq/L (ref 3.7–5.3)
Sodium: 129 mEq/L — ABNORMAL LOW (ref 137–147)

## 2014-07-24 MED ORDER — GADOBENATE DIMEGLUMINE 529 MG/ML IV SOLN
15.0000 mL | Freq: Once | INTRAVENOUS | Status: AC
  Administered 2014-07-24: 12 mL via INTRAVENOUS

## 2014-07-24 NOTE — Progress Notes (Signed)
Inpatient Diabetes Program Recommendations  AACE/ADA: New Consensus Statement on Inpatient Glycemic Control (2013)  Target Ranges:  Prepandial:   less than 140 mg/dL      Peak postprandial:   less than 180 mg/dL (1-2 hours)      Critically ill patients:  140 - 180 mg/dL     Results for Anthony Lopez, Anthony Lopez (MRN [id number]) as of 07/24/2014 10:14  Ref. Range 07/22/2014 07:27 07/23/2014 12:14  Glucose-Capillary Latest Range: 70-99 mg/dL 297 (H) 221 (H)     Patient admitted for Craniotomy for tumor resection.  No History of DM noted in H&P.  Patient takes Decadron 4 mg Q8 hours at home.  Glucose levels are elevated.    MD- Please consider the following:   1. Start Novolog Sensitive SSI tid ac + HS 2. May want to check a Hemoglobin A1c level to assess prior glucose control prior to admission     Will follow Wyn Quaker RN, MSN, CDE Diabetes Coordinator Inpatient Diabetes Program Team Pager: [phone] (8a-10p)

## 2014-07-24 NOTE — Anesthesia Postprocedure Evaluation (Signed)
  Anesthesia Post-op Note  Patient: Anthony Lopez  Procedure(s) Performed: Procedure(s) with comments: RIGHT CRANIOTOMY FOR TUMOR WITH CURVE (Right) - RIGHT CRANIOTOMY FOR TUMOR WITH CURVE  Patient Location: PACU  Anesthesia Type:General  Level of Consciousness: awake, alert , oriented and patient cooperative  Airway and Oxygen Therapy: Patient Spontanous Breathing  Post-op Pain: mild  Post-op Assessment: Post-op Vital signs reviewed, Patient's Cardiovascular Status Stable, Respiratory Function Stable, Patent Airway, No signs of Nausea or vomiting and Pain level controlled  Post-op Vital Signs: stable  Last Vitals:  Filed Vitals:   07/24/14 0800  BP:   Pulse:   Temp: 37.1 C  Resp:     Complications: No apparent anesthesia complications

## 2014-07-24 NOTE — Progress Notes (Signed)
Subjective: Patient reports he is doing very well. Very mild headache. No weakness or numbness or tingling or visual changes  Objective: Vital signs in last 24 hours: Temp:  [97.5 F (36.4 C)-98.7 F (37.1 C)] 98.7 F (37.1 C) (12/17 0800) Pulse Rate:  [50-81] 77 (12/17 1000) Resp:  [10-18] 13 (12/17 1000) BP: (112-162)/(52-68) 120/56 mmHg (12/17 1000) SpO2:  [46 %-100 %] 93 % (12/17 1000) Arterial Line BP: (95-155)/(59-74) 103/72 mmHg (12/17 0800) Weight:  [137 lb 2 oz (62.2 kg)] 137 lb 2 oz (62.2 kg) (12/16 1826)  Intake/Output from previous day: 12/16 0730 - 12/17 0729 In: 962.5 [I.V.:712.5; IV Piggyback:250] Out: 425 [Urine:375; Blood:50] Intake/Output this shift: Total I/O In: 400 [I.V.:300; IV Piggyback:100] Out: 340 [Urine:340]  He is awake and alert. His dressing is dry. He is moving all extremities. He is conversant.  Lab Results: Lab Results  Component Value Date   WBC 16.5* 07/23/2014   HGB 13.6 07/23/2014   HCT 38.8* 07/23/2014   MCV 89.2 07/23/2014   PLT 170 07/23/2014   Lab Results  Component Value Date   INR 1.07 07/06/2014   BMET Lab Results  Component Value Date   NA 129* 07/23/2014   K 5.2 07/23/2014   CL 94* 07/18/2014   CO2 23 07/18/2014   GLUCOSE 225* 07/23/2014   BUN 40* 07/18/2014   CREATININE 1.13 07/18/2014   CALCIUM 9.1 07/18/2014    Studies/Results: No results found.  Assessment/Plan: Doing very well postop day 1 craniotomy for tumor. Mobilize today. Remove Foley and arterial line.   LOS: 1 day    Darrek Leasure S 07/24/2014, 12:04 PM

## 2014-07-24 NOTE — Progress Notes (Signed)
UR completed. Pt noted to have used [place] for previous [place].   Sandi Mariscal, RN BSN [place] CCM Trauma/Neuro ICU Case Manager [phone]

## 2014-07-24 NOTE — Progress Notes (Signed)
  Radiation Oncology         [phone] ________________________________  Name: Anthony Lopez MRN: [id number]  Date: 07/21/2014  DOB: 04/06/1946  End of Treatment Note  Diagnosis:    1. 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 The 15 Gy dose to the right frontal lesion was representative of preoperative stereotactic radiosurgery. ExacTrac registration was performed for each couch angle. The 100% isodose line was prescribed.  Right occipital 8 mm target was treated using 3 Dynamic Conformal Arcs to a prescription dose of 20 Gy. ExacTrac registration was performed for each couch angle. The 100% isodose line was prescribed.  Narrative: The patient tolerated radiation treatment relatively well.   No acute complications occurred  Plan: The patient has completed radiation treatment. Following treatment, the patient was scheduled to proceed with right frontal craniotomy to resect the largest of the lesions. The patient will return to radiation oncology clinic for routine followup in one month. I advised them to call or return sooner if they have any questions or concerns related to their recovery or treatment. ________________________________  Sheral Apley. Tammi Klippel, M.D.

## 2014-07-25 MED ORDER — PANTOPRAZOLE SODIUM 40 MG PO TBEC
40.0000 mg | DELAYED_RELEASE_TABLET | Freq: Every day | ORAL | Status: DC
  Administered 2014-07-25 – 2014-07-26 (×2): 40 mg via ORAL
  Filled 2014-07-25 (×2): qty 1

## 2014-07-25 MED ORDER — NICOTINE 21 MG/24HR TD PT24
21.0000 mg | MEDICATED_PATCH | Freq: Every day | TRANSDERMAL | Status: DC
  Administered 2014-07-25 – 2014-07-26 (×2): 21 mg via TRANSDERMAL
  Filled 2014-07-25 (×3): qty 1

## 2014-07-25 MED ORDER — DEXAMETHASONE SODIUM PHOSPHATE 4 MG/ML IJ SOLN
4.0000 mg | Freq: Four times a day (QID) | INTRAMUSCULAR | Status: AC
  Administered 2014-07-25 – 2014-07-26 (×4): 4 mg via INTRAVENOUS
  Filled 2014-07-25 (×6): qty 1

## 2014-07-25 NOTE — Progress Notes (Signed)
Subjective: Patient reports he is doing well. He feels better than yesterday. No real headache or numbness tingling or weakness.  Objective: Vital signs in last 24 hours: Temp:  [98 F (36.7 C)-98.7 F (37.1 C)] 98 F (36.7 C) (12/18 0348) Pulse Rate:  [25-106] 74 (12/18 0600) Resp:  [13-21] 17 (12/18 0600) BP: (101-143)/(50-67) 113/59 mmHg (12/18 0600) SpO2:  [92 %-98 %] 93 % (12/18 0600)  Intake/Output from previous day: 12/17 0730 - 12/18 0729 In: 1000 [P.O.:600; I.V.:300; IV Piggyback:100] Out: 765 [Urine:765] Intake/Output this shift:    Neurologic: Grossly normal  Lab Results: Lab Results  Component Value Date   WBC 16.5* 07/23/2014   HGB 11.2* 07/23/2014   HCT 33.0* 07/23/2014   MCV 89.2 07/23/2014   PLT 170 07/23/2014   Lab Results  Component Value Date   INR 1.07 07/06/2014   BMET Lab Results  Component Value Date   NA 129* 07/23/2014   K 4.2 07/23/2014   CL 94* 07/18/2014   CO2 23 07/18/2014   GLUCOSE 166* 07/23/2014   BUN 40* 07/18/2014   CREATININE 1.13 07/18/2014   CALCIUM 9.1 07/18/2014    Studies/Results: Mr Jeri Cos Wo Contrast  07/24/2014   CLINICAL DATA:  Lung cancer with brain metastases. Postop day 1 right foraminal brain metastasis resection.  EXAM: MRI HEAD WITHOUT AND WITH CONTRAST  TECHNIQUE: Multiplanar, multiecho pulse sequences of the brain and surrounding structures were obtained without and with intravenous contrast.  CONTRAST:  46mL MULTIHANCE GADOBENATE DIMEGLUMINE 529 MG/ML IV SOLN  COMPARISON:  07/15/2014  FINDINGS: There is a 9 mm focus of restricted diffusion involving anterior left frontal cortex on coronal diffusion images (series 7 images 30 and 31). Sequelae of interval right frontal craniotomy and right frontal mass resection are identified. Minimal right frontal extra-axial fluid/ hematoma is noted. Small amount of pneumocephalus is noted. There is also trace blood in the atrium/ occipital horn of the left lateral ventricle.   Vasogenic edema in the right frontal lobe has decreased from the prior study. Blood products are present in the right frontal resection cavity with associated intrinsic T1 hyperintensity. Mild restricted diffusion at the margins of the cavity is likely postoperative. There is likely minimal enhancement at the margins of the cavity without clear nodular enhancement identified, however evaluation is mildly limited by motion artifact, blood products, and different scanning technique between the pre and postcontrast axial T1 images.  Ring-enhancing right parietal lesion measures 2.9 x 2.4 cm, unchanged. Surrounding vasogenic edema has slightly decreased. 1.1 cm right occipital lesion does not appear significantly changed. There is a 3 mm punctate focus of cortical enhancement in the right frontoparietal region near the vertex which was not clearly present on the prior studies (series 11, image 21 and series 12, image 14). There is no associated edema.  Apparent enhancement in the left superior temporal gyrus on the prior study is no longer seen and was likely artifactual as previously suggested. Leftward midline shift is noted. Chronic left greater than right basal ganglia and bilateral thalamic lacunar infarcts are again seen.  Orbits are unremarkable. Focal right posterior ethmoid air cell mucosal thickening is noted. Mastoid air cells are clear. Major intracranial vascular flow voids are preserved.  IMPRESSION: 1. Interval right frontal mass resection with minimal enhancement at the margins of the cavity as above. Decreased adjacent edema. 2. Unchanged right parietal and right occipital lesions. 3. New 3 mm focus of enhancement in the right frontoparietal region, possibly an additional metastasis. 4. Subcentimeter  acute left frontal cortical infarct.   Electronically Signed   By: Logan Bores   On: 07/24/2014 12:34    Assessment/Plan: MRI reviewed. Doing well. 2 floor today.   LOS: 2 days    JONES,DAVID  S 07/25/2014, 8:01 AM

## 2014-07-25 NOTE — Progress Notes (Signed)
Patient arrived from [facility] via wheelchair with family at bedside. Safety precautions and orders reviewed with patient/family. Call light with reach, alarm activated. Patient c/o minimal headache but refused pain medication. Otherwise he appears in no distress at this time. Will continue to monitor.  Ave Filter, RN

## 2014-07-25 NOTE — Evaluation (Signed)
Physical Therapy Evaluation Patient Details Name: DAYSEAN TINKHAM MRN: [id number] DOB: 1946-05-18 Today's Date: 07/25/2014   History of Present Illness  pt presents post Crani for Tumor resection.    Clinical Impression  Pt with mild balance deficits and occasional scissoring gait.  Feel pt would benefit from OPPT for further high level balance tasks.  Difficult to full assessment on pt cognition as wife tends to answer questions for him.  Will continue to follow while on acute.      Follow Up Recommendations Outpatient PT;Supervision - Intermittent    Equipment Recommendations  None recommended by PT    Recommendations for Other Services       Precautions / Restrictions Precautions Precautions: Fall Restrictions Weight Bearing Restrictions: No      Mobility  Bed Mobility Overal bed mobility: Modified Independent                Transfers Overall transfer level: Needs assistance Equipment used: None Transfers: Sit to/from Stand Sit to Stand: Supervision         General transfer comment: Close S.  pt utilizes UEs for support.    Ambulation/Gait Ambulation/Gait assistance: Min guard Ambulation Distance (Feet): 150 Feet Assistive device: None Gait Pattern/deviations: Step-through pattern;Decreased stride length;Scissoring     General Gait Details: pt tends to scissor R LE over L at times and indicates this is not baseline for him.  With cueing pt able to attend to scissoring and attempts to correct.  One LOB requiring MinA during ambulation.    Stairs            Wheelchair Mobility    Modified Rankin (Stroke Patients Only)       Balance Overall balance assessment: Needs assistance Sitting-balance support: No upper extremity supported;Feet supported Sitting balance-Leahy Scale: Good     Standing balance support: No upper extremity supported Standing balance-Leahy Scale: Fair Standing balance comment: pt unable to accept balance challenges  without UE support or A.                               Pertinent Vitals/Pain Pain Assessment: 0-10 Pain Score: 4  Pain Location: Headache Pain Descriptors / Indicators: Headache Pain Intervention(s): Monitored during session;Premedicated before session;Repositioned    Home Living Family/patient expects to be discharged to:: Private residence Living Arrangements: Spouse/significant other Available Help at Discharge: Family;Available 24 hours/day Type of Home: House Home Access: Stairs to enter Entrance Stairs-Rails: Right Entrance Stairs-Number of Steps: 3 Home Layout: One level Home Equipment: Walker - 2 wheels      Prior Function Level of Independence: Independent         Comments: pt used RW briefly after last hospital admit only.       Hand Dominance   Dominant Hand: Right    Extremity/Trunk Assessment   Upper Extremity Assessment: Overall WFL for tasks assessed           Lower Extremity Assessment: Overall WFL for tasks assessed      Cervical / Trunk Assessment: Normal  Communication   Communication: No difficulties  Cognition Arousal/Alertness: Awake/alert Behavior During Therapy: WFL for tasks assessed/performed Overall Cognitive Status: Impaired/Different from baseline Area of Impairment: Safety/judgement         Safety/Judgement: Decreased awareness of safety;Decreased awareness of deficits          General Comments      Exercises        Assessment/Plan  PT Assessment Patient needs continued PT services  PT Diagnosis Difficulty walking   PT Problem List Decreased activity tolerance;Decreased balance;Decreased mobility;Decreased coordination;Decreased knowledge of use of DME;Decreased safety awareness  PT Treatment Interventions DME instruction;Gait training;Stair training;Functional mobility training;Therapeutic activities;Therapeutic exercise;Balance training;Neuromuscular re-education;Patient/family  education;Cognitive remediation   PT Goals (Current goals can be found in the Care Plan section) Acute Rehab PT Goals Patient Stated Goal: go home PT Goal Formulation: With patient/family Time For Goal Achievement: 08/01/14 Potential to Achieve Goals: Good    Frequency Min 3X/week   Barriers to discharge        Co-evaluation               End of Session Equipment Utilized During Treatment: Gait belt Activity Tolerance: Patient tolerated treatment well Patient left: in bed;with call bell/phone within reach;with family/visitor present Nurse Communication: Mobility status         Time: 1030-1043 PT Time Calculation (min) (ACUTE ONLY): 13 min   Charges:   PT Evaluation $Initial PT Evaluation Tier I: 1 Procedure PT Treatments $Gait Training: 8-22 mins   PT G CodesCatarina Hartshorn, [place] 07/25/2014, 11:04 AM

## 2014-07-26 MED ORDER — DEXAMETHASONE 1 MG PO TABS: ORAL_TABLET | ORAL | Status: DC

## 2014-07-26 MED ORDER — HYDROCODONE-ACETAMINOPHEN 5-325 MG PO TABS: 1.0000 | ORAL_TABLET | ORAL | Status: DC | PRN

## 2014-07-26 NOTE — Discharge Instructions (Signed)
Craniotomy, Care After Refer to this sheet in the next few weeks. These instructions provide you with information on caring for yourself after your procedure. Your health care provider may also give you more specific instructions. Your treatment has been planned according to current medical practices, but problems sometimes occur. Call your health care provider if you have any problems or questions after your procedure. WHAT TO EXPECT AFTER THE PROCEDURE After your procedure, it is typical to have the following:  Your scalp may feel spongy for a while because of fluid under it. This will gradually get better.  You may have numbness for a while in some areas of your scalp. HOME CARE INSTRUCTIONS   Sleep and rest. When lying down, keep your head elevated at a 30 degree angle. This helps keep brain swelling down and prevents increased pressure in the head. This is important immediately after surgery. Ask your health care provider when you can go back to sleeping flat.   Change dressings as directed by your health care provider. You may need to have someone change your dressings for you.  Keep the wound clean and dry. Wash the wound gently with soap and water. Blot or dab the wound dry without rubbing it.  Wear a helmet as directed by your health care provider.  Only take over-the-counter or prescription medicines as directed by your health care provider. Do not take other medicines without asking your health care provider first.  Do not use alcohol.   Take showers if your health care provider approves. Cover the incision area before the shower as directed. Do not take baths or use swimming pools or hot tubs for 10 days or as directed by your health care provider.  Continue your normal diet as directed by your health care provider.   Do not drive until your health care provider approves.  Limit activities or movements as directed by your health care provider. You may take short walks if  your health care provider approves. Increase your activity gradually. Wait at least 3 months before you return to mild, noncontact sports. Avoid contact sports for at least 1 year or as directed by your health care provider.   Ask your health care provider when you can return to work.  Follow up with your health care provider as directed. Stitches or staples are usually removed about 1 week after surgery. SEEK MEDICAL CARE IF:  You have redness, swelling, or increasing pain in the wound or pin insertion sites.   You have drainage or pus coming from the wound.  SEEK IMMEDIATE MEDICAL CARE IF:   You feel nauseous, or you vomit.   You feel confused.  You have severe headaches.  You have a seizure.   You have chest pain, a stiff neck, or difficulty breathing.   You have an increase in swelling or bruising around the eyes.   You have a fever.   You notice a bad smell coming from the wound or dressing.   Your wound breaks open after the stitches or staples have been removed.   You have dizziness or faint while standing.   You have a rash.  MAKE SURE YOU:  Understand these instructions.  Will watch your condition.  Will get help right away if you are not doing well or get worse. Document Released: 10/25/2005 Document Revised: 05/15/2013 Document Reviewed: 03/06/2013 [facility] Patient Information 2015 [place], [place]. This information is not intended to replace advice given to you by your health care provider. Make  sure you discuss any questions you have with your health care provider.

## 2014-07-26 NOTE — Progress Notes (Signed)
Patient was discharged home with wife.  Discharge instructions and medications reviewed with patient and wife. Patient and wife state they understood both. Both IV removed.

## 2014-07-26 NOTE — Progress Notes (Signed)
Physical Therapy  Pt seen for reassessment of balance and has improved greatly. Discussed with pt/daughter and pt no longer needs OPPT at this time. No DME needs. OK for d/c from PT/mobility standpoint.   Full note to follow.   07/26/2014 Camerin Brunner, PT Pager: [phone]

## 2014-07-26 NOTE — Discharge Summary (Signed)
Physician Discharge Summary  Patient ID: [name] MRN: [id number] DOB/AGE: 17-Sep-1945 68 y.o.  Admit date: 07/23/2014 Discharge date: 07/26/2014  Admission Diagnoses: Right frontal metastatic brain tumor  Discharge Diagnoses: Right frontal metastatic brain tumor Active Problems:   S/P craniotomy   Discharged Condition: good  Hospital Course: Patient was admitted to undergo surgical decompression which he tolerated well  Consults: None  Significant Diagnostic Studies: None  Treatments: surgery: Right frontal craniotomy gross total excision of brain tumor with frameless stereotactic assistance  Discharge Exam: Blood pressure 122/64, pulse 67, temperature 97.5 F (36.4 C), temperature source Oral, resp. rate 19, height 5\' 10"  (1.778 m), weight 62.2 kg (137 lb 2 oz), SpO2 97 %. Normal  Disposition: 06-Home-Health Care Svc  Discharge Instructions    Call MD for:  redness, tenderness, or signs of infection (pain, swelling, redness, odor or green/yellow discharge around incision site)    Complete by:  As directed      Call MD for:  severe uncontrolled pain    Complete by:  As directed      Call MD for:  temperature >100.4    Complete by:  As directed      Diet - low sodium heart healthy    Complete by:  As directed      Increase activity slowly    Complete by:  As directed             Medication List    TAKE these medications        dexamethasone 1 MG tablet  Commonly known as:  DECADRON  2 tablets twice daily for 2 days, one tablet twice daily for 2 days, one tablet daily for 2 days.     Fish Oil 1000 MG Caps  Take by mouth.     HYDROcodone-acetaminophen 5-325 MG per tablet  Commonly known as:  NORCO/VICODIN  Take 1 tablet by mouth every 4 (four) hours as needed for moderate pain.     lisinopril 10 MG tablet  Commonly known as:  PRINIVIL,ZESTRIL  Take 10 mg by mouth daily.         SignedEarleen Newport 07/26/2014, 1:53 PM

## 2014-07-26 NOTE — Progress Notes (Signed)
Physical Therapy Treatment Patient Details Name: Anthony Lopez MRN: [id number] DOB: [birth year] Today's Date: 07/28/14    History of Present Illness pt presents post Crani for Tumor resection.      PT Comments    Pt doing much better today and is ready for discharge from a mobility standpoint. Pt's daughter present and pt will have appropriate assistance/supervision on d/c.    Follow Up Recommendations  Supervision - Intermittent;No PT follow up     Equipment Recommendations  None recommended by PT    Recommendations for Other Services       Precautions / Restrictions Precautions Precautions: Fall Restrictions Weight Bearing Restrictions: No    Mobility  Bed Mobility                  Transfers Overall transfer level: Independent Equipment used: None   Sit to Stand: Independent            Ambulation/Gait Ambulation/Gait assistance: Supervision Ambulation Distance (Feet): 180 Feet Assistive device: None Gait Pattern/deviations: WFL(Within Functional Limits)   Gait velocity interpretation: Below normal speed for age/gender General Gait Details: WFL, no scissor or narrow BOS   Stairs Stairs: Yes Stairs assistance: Supervision Stair Management: One rail Right;Alternating pattern;Forwards Number of Stairs: 5 General stair comments: no cues needed  Wheelchair Mobility    Modified Rankin (Stroke Patients Only)       Balance     Sitting balance-Leahy Scale: Good       Standing balance-Leahy Scale: Good                      Cognition Arousal/Alertness: Awake/alert Behavior During Therapy: WFL for tasks assessed/performed Overall Cognitive Status: Impaired/Different from baseline           Safety/Judgement: Decreased awareness of safety;Decreased awareness of deficits     General Comments: Daughter concerned pt will overwork himself; he has poor awareness of limitations    Exercises      General Comments General  comments (skin integrity, edema, etc.): Daughter present. Discussed possible role of HHPT vs OPPT and pt would not qualify for HHPT at current level. They prefer to not start OPPT on d/c, however if he continues to have balance/gait problems when he sees MD in 10 days, they will ask for OPPT      Pertinent Vitals/Pain Pain Assessment: No/denies pain    Home Living                      Prior Function            PT Goals (current goals can now be found in the care plan section) Acute Rehab PT Goals Patient Stated Goal: go home Progress towards PT goals: Progressing toward goals    Frequency  Min 3X/week    PT Plan Discharge plan needs to be updated    Co-evaluation             End of Session Equipment Utilized During Treatment: Gait belt Activity Tolerance: Patient tolerated treatment well Patient left: with call bell/phone within reach;with family/visitor present;in chair     Time: 2951-8841 PT Time Calculation (min) (ACUTE ONLY): 10 min  Charges:  $Gait Training: 8-22 mins                    G Codes:      Anthony Lopez July 28, 2014, 3:33 PM Pager [phone]

## 2014-07-28 ENCOUNTER — Encounter (HOSPITAL_COMMUNITY): Payer: Self-pay | Admitting: Neurological Surgery

## 2014-08-05 ENCOUNTER — Other Ambulatory Visit (HOSPITAL_BASED_OUTPATIENT_CLINIC_OR_DEPARTMENT_OTHER): Payer: Medicare HMO

## 2014-08-05 ENCOUNTER — Encounter: Payer: Self-pay | Admitting: Physician Assistant

## 2014-08-05 ENCOUNTER — Encounter: Payer: Self-pay | Admitting: Internal Medicine

## 2014-08-05 ENCOUNTER — Ambulatory Visit: Payer: Medicare HMO | Admitting: Nutrition

## 2014-08-05 ENCOUNTER — Ambulatory Visit (HOSPITAL_BASED_OUTPATIENT_CLINIC_OR_DEPARTMENT_OTHER): Payer: Medicare HMO | Admitting: Internal Medicine

## 2014-08-05 ENCOUNTER — Telehealth: Payer: Self-pay | Admitting: Internal Medicine

## 2014-08-05 VITALS — BP 127/64 | HR 72 | Temp 97.6°F | Resp 18 | Ht 70.0 in | Wt 123.7 lb

## 2014-08-05 DIAGNOSIS — C3432 Malignant neoplasm of lower lobe, left bronchus or lung: Secondary | ICD-10-CM

## 2014-08-05 DIAGNOSIS — C7931 Secondary malignant neoplasm of brain: Secondary | ICD-10-CM

## 2014-08-05 LAB — COMPREHENSIVE METABOLIC PANEL (CC13)
ALK PHOS: 89 U/L (ref 40–150)
ALT: 19 U/L (ref 0–55)
ANION GAP: 12 meq/L — AB (ref 3–11)
AST: 10 U/L (ref 5–34)
Albumin: 2.6 g/dL — ABNORMAL LOW (ref 3.5–5.0)
BUN: 30.9 mg/dL — AB (ref 7.0–26.0)
CALCIUM: 9 mg/dL (ref 8.4–10.4)
CO2: 27 mEq/L (ref 22–29)
Chloride: 94 mEq/L — ABNORMAL LOW (ref 98–109)
Creatinine: 1.1 mg/dL (ref 0.7–1.3)
EGFR: 70 mL/min/{1.73_m2} — AB (ref >90)
Glucose: 468 mg/dl — ABNORMAL HIGH (ref 70–140)
POTASSIUM: 3.8 meq/L (ref 3.5–5.1)
Sodium: 133 mEq/L — ABNORMAL LOW (ref 136–145)
Total Bilirubin: 0.35 mg/dL (ref 0.20–1.20)
Total Protein: 6.3 g/dL — ABNORMAL LOW (ref 6.4–8.3)

## 2014-08-05 LAB — CBC WITH DIFFERENTIAL/PLATELET
BASO%: 0.7 % (ref 0.0–2.0)
Basophils Absolute: 0.1 10*3/uL (ref 0.0–0.1)
EOS%: 1.8 % (ref 0.0–7.0)
Eosinophils Absolute: 0.2 10*3/uL (ref 0.0–0.5)
HEMATOCRIT: 36 % — AB (ref 38.4–49.9)
HGB: 11.9 g/dL — ABNORMAL LOW (ref 13.0–17.1)
LYMPH%: 17 % (ref 14.0–49.0)
MCH: 31 pg (ref 27.2–33.4)
MCHC: 33.1 g/dL (ref 32.0–36.0)
MCV: 93.4 fL (ref 79.3–98.0)
MONO#: 0.7 10*3/uL (ref 0.1–0.9)
MONO%: 6.3 % (ref 0.0–14.0)
NEUT#: 8 10*3/uL — ABNORMAL HIGH (ref 1.5–6.5)
NEUT%: 74.2 % (ref 39.0–75.0)
Platelets: 200 10*3/uL (ref 140–400)
RBC: 3.86 10*6/uL — AB (ref 4.20–5.82)
RDW: 13.8 % (ref 11.0–14.6)
WBC: 10.7 10*3/uL — ABNORMAL HIGH (ref 4.0–10.3)
lymph#: 1.8 10*3/uL (ref 0.9–3.3)

## 2014-08-05 NOTE — Telephone Encounter (Signed)
gv and printed appt sched and avs for pt for Jan 5 @ 8:15 [facility]

## 2014-08-05 NOTE — Progress Notes (Signed)
68 year old male diagnosed with metastatic lung cancer status post craniotomy for right metastatic brain tumor.  He is a patient of Dr. Earlie Server and Dr. Tammi Klippel.  Past medical history includes CVA, hypertension, rheumatic fever, tobacco, and anxiety.  Medications include Decadron and omega-3 fatty acids.  Labs include sodium 129, glucose 438 on December 11.  Height: 5 feet 10 inches. Weight: 123.7 pounds December 29. Usual body weight: 144 pounds January 2015. BMI: 17.75.  Patient reports recent hospitalization.  States he is healing well after surgery. Appetite is decreased. Patient has lost approximately 14 pounds over the past 2 weeks.  Patient meets criteria for severe malnutrition in the context of chronic illness secondary to 10% weight loss over 2 weeks, less than 75% energy intake for greater than one month, and severe depletion of body fat and muscle mass on physical exam.  Nutrition diagnosis: Inadequate oral intake related to poor appetite as evidenced by 14% weight loss of usual body weight with 10% weight loss over the past 2 weeks and BMI of 17.75.  Intervention: Patient educated to consume high-calorie high-protein foods in 3 meals +3 snacks daily. Educated patient and made a list of high-calorie, high-protein foods he enjoys. Provided fact sheet on increasing calories and protein. Patient declines oral nutrition supplements. Questions were answered.  Teach back method used.  Contact information was given.  Monitoring, evaluation, goals: Patient will work to increase oral intake to promote weight gain.  Next visit: To be scheduled as needed.  Patient has my contact information.  **Disclaimer: This note was dictated with voice recognition software. Similar sounding words can inadvertently be transcribed and this note may contain transcription errors which may not have been corrected upon publication of note.**

## 2014-08-05 NOTE — Progress Notes (Signed)
[facility] [phone]   [phone]  OFFICE PROGRESS NOTE  Anthony Lopez [address] [place]  [address]  DIAGNOSIS: Stage IV (T2a, N1, M1b) non-small cell lung cancer, poorly differentiated carcinoma with glandular and squamous differentiation diagnosed in November 2015 presented with right lower lobe cavitary lesion in addition to left hilar lymphadenopathy and metastatic brain lesions.  PRIOR THERAPY: 1) stereotactic radio surgery to 3 brain lesions under the care of Dr. Tammi Klippel on 07/21/2014. 2) Right frontal craniotomy for resection of brain metastasis utilizing frameless stereotactic navigation under the care of Dr. Sherley Bounds on 07/23/2014.   CURRENT THERAPY: None.  INTERVAL HISTORY: Anthony Lopez 68 y.o. male returns to the clinic today for follow-up visit accompanied by his wife. The patient is feeling fine today with no specific complaints except for the weight loss and lack of appetite. He is scheduled for another stereotactic radiotherapy by Dr. Tammi Klippel on 08/18/2014. The patient underwent a few studies recently including a PET scan. He is here today for evaluation and discussion of his scan and treatment options. He denied having any significant chest pain but has shortness breath with exertion with no cough or hemoptysis. The patient denied having any significant fever or chills. He has no nausea or vomiting.  MEDICAL HISTORY: Past Medical History  Diagnosis Date  . CVA (cerebral infarction) 2000  . HTN (hypertension), benign   . Rheumatic fever     Age 72 or 53  . Tobacco abuse   . Lung cancer   . Brain cancer   . Anxiety   . Headache   . Dizziness     ALLERGIES:  is allergic to doxycycline and penicillins.  MEDICATIONS:  Current Outpatient Prescriptions  Medication Sig Dispense Refill  . aspirin-acetaminophen-caffeine (EXCEDRIN MIGRAINE) 250-250-65 MG per tablet Take 1 tablet by mouth every 6 (six)  hours as needed for headache.    . dexamethasone (DECADRON) 1 MG tablet 2 tablets twice daily for 2 days, one tablet twice daily for 2 days, one tablet daily for 2 days. 15 tablet 0  . lisinopril (PRINIVIL,ZESTRIL) 10 MG tablet Take 10 mg by mouth daily.    . Omega-3 Fatty Acids (FISH OIL) 1000 MG CAPS Take by mouth.    Marland Kitchen HYDROcodone-acetaminophen (NORCO/VICODIN) 5-325 MG per tablet Take 1 tablet by mouth every 4 (four) hours as needed for moderate pain. (Patient not taking: Reported on 08/05/2014) 30 tablet 0   No current facility-administered medications for this visit.    SURGICAL HISTORY:  Past Surgical History  Procedure Laterality Date  . Transthoracic echocardiogram  January 2004    Normal LV size and function. EF 55-65%.  . Carotid dopplers Bilateral 07/18/2013    Right subclavian 0-49%, left subclavian mild bilateral carotid stenosis..  . 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   . Mohs surgery      h/o NMSC (scc or bcc)  . Nose surgery      cancer removed  . Hernia repair    . Craniotomy Right 07/23/2014    Procedure: RIGHT CRANIOTOMY FOR TUMOR WITH CURVE;  Surgeon: Eustace Moore, MD;  Location: [facility];  Service: Neurosurgery;  Laterality: Right;  RIGHT CRANIOTOMY FOR TUMOR WITH CURVE    REVIEW OF SYSTEMS:  A comprehensive review of systems was negative except for: Constitutional: positive for anorexia and weight loss Respiratory:  positive for dyspnea on exertion Neurological: positive for headaches   PHYSICAL EXAMINATION: General appearance: alert, cooperative, fatigued and no distress Head: Normocephalic, without obvious abnormality, atraumatic Neck: no adenopathy, no JVD, supple, symmetrical, trachea midline and thyroid not enlarged, symmetric, no tenderness/mass/nodules Lymph nodes: Cervical, supraclavicular, and axillary nodes normal. Resp: clear to  auscultation bilaterally Back: symmetric, no curvature. ROM normal. No CVA tenderness. Cardio: regular rate and rhythm, S1, S2 normal, no murmur, click, rub or gallop GI: soft, non-tender; bowel sounds normal; no masses,  no organomegaly Extremities: extremities normal, atraumatic, no cyanosis or edema  ECOG PERFORMANCE STATUS: 1 - Symptomatic but completely ambulatory  Blood pressure 127/64, pulse 72, temperature 97.6 F (36.4 C), temperature source Oral, resp. rate 18, height 5\' 10"  (1.778 m), weight 123 lb 11.2 oz (56.11 kg), SpO2 99 %.  LABORATORY DATA: Lab Results  Component Value Date   WBC 10.7* 08/05/2014   HGB 11.9* 08/05/2014   HCT 36.0* 08/05/2014   MCV 93.4 08/05/2014   PLT 200 08/05/2014      Chemistry      Component Value Date/Time   NA 133* 08/05/2014 0948   NA 129* 07/23/2014 1621   K 3.8 08/05/2014 0948   K 4.2 07/23/2014 1621   CL 94* 07/18/2014 1359   CO2 27 08/05/2014 0948   CO2 23 07/18/2014 1359   BUN 30.9* 08/05/2014 0948   BUN 40* 07/18/2014 1359   CREATININE 1.1 08/05/2014 0948   CREATININE 1.13 07/18/2014 1359      Component Value Date/Time   CALCIUM 9.0 08/05/2014 0948   CALCIUM 9.1 07/18/2014 1359   ALKPHOS 89 08/05/2014 0948   ALKPHOS 57 07/03/2014 1815   AST 10 08/05/2014 0948   AST 11 07/03/2014 1815   ALT 19 08/05/2014 0948   ALT 9 07/03/2014 1815   BILITOT 0.35 08/05/2014 0948   BILITOT 0.2* 07/03/2014 1815       RADIOGRAPHIC STUDIES: Dg Chest 1 View  07/07/2014   CLINICAL DATA:  Status post left lung biopsy  EXAM: CHEST - 1 VIEW  COMPARISON:  07/07/2014  FINDINGS: Status post left lung biopsy. Left lower lobe reticular nodular interstitial disease with a cavitary mass in the superior segment of the left lower lobe. The right lung is clear. There is no pneumothorax. Stable cardiomediastinal silhouette. Unremarkable osseous structures.  IMPRESSION: 1. No left pneumothorax status post left lung biopsy.   Electronically Signed   By:  Kathreen Devoid   On: 07/07/2014 15:53   Mr Brain W Wo Contrast  07/24/2014   CLINICAL DATA:  Lung cancer with brain metastases. Postop day 1 right foraminal brain metastasis resection.  EXAM: MRI HEAD WITHOUT AND WITH CONTRAST  TECHNIQUE: Multiplanar, multiecho pulse sequences of the brain and surrounding structures were obtained without and with intravenous contrast.  CONTRAST:  98mL MULTIHANCE GADOBENATE DIMEGLUMINE 529 MG/ML IV SOLN  COMPARISON:  07/15/2014  FINDINGS: There is a 9 mm focus of restricted diffusion involving anterior left frontal cortex on coronal diffusion images (series 7 images 30 and 31). Sequelae of interval right frontal craniotomy and right frontal mass resection are identified. Minimal right frontal extra-axial fluid/ hematoma is noted. Small amount of pneumocephalus is noted. There is also trace blood in the atrium/ occipital horn of the left lateral ventricle.  Vasogenic edema in the right frontal lobe has decreased from the prior study. Blood products are present in the right frontal resection cavity with associated intrinsic T1 hyperintensity. Mild restricted diffusion at the margins of the  cavity is likely postoperative. There is likely minimal enhancement at the margins of the cavity without clear nodular enhancement identified, however evaluation is mildly limited by motion artifact, blood products, and different scanning technique between the pre and postcontrast axial T1 images.  Ring-enhancing right parietal lesion measures 2.9 x 2.4 cm, unchanged. Surrounding vasogenic edema has slightly decreased. 1.1 cm right occipital lesion does not appear significantly changed. There is a 3 mm punctate focus of cortical enhancement in the right frontoparietal region near the vertex which was not clearly present on the prior studies (series 11, image 21 and series 12, image 14). There is no associated edema.  Apparent enhancement in the left superior temporal gyrus on the prior study is no  longer seen and was likely artifactual as previously suggested. Leftward midline shift is noted. Chronic left greater than right basal ganglia and bilateral thalamic lacunar infarcts are again seen.  Orbits are unremarkable. Focal right posterior ethmoid air cell mucosal thickening is noted. Mastoid air cells are clear. Major intracranial vascular flow voids are preserved.  IMPRESSION: 1. Interval right frontal mass resection with minimal enhancement at the margins of the cavity as above. Decreased adjacent edema. 2. Unchanged right parietal and right occipital lesions. 3. New 3 mm focus of enhancement in the right frontoparietal region, possibly an additional metastasis. 4. Subcentimeter acute left frontal cortical infarct.   Electronically Signed   By: Logan Bores   On: 07/24/2014 12:34   Mr Brain W Wo Contrast  07/15/2014   CLINICAL DATA:  Lung cancer with suspected metastases. Brain lab.  EXAM: MRI HEAD WITHOUT AND WITH CONTRAST  TECHNIQUE: Multiplanar, multiecho pulse sequences of the brain and surrounding structures were obtained without and with intravenous contrast.  CONTRAST:  15mL MULTIHANCE GADOBENATE DIMEGLUMINE 529 MG/ML IV SOLN  COMPARISON:  07/04/2014 MR head.  FINDINGS: Redemonstrated is moderate cerebral edema associated with RIGHT hemisphere ring enhancing mass lesions, suspected brain metastases from lung cancer. Central restricted diffusion of some but, but not all of the lesions, unlikely to represent abscesses. Some blood products or mineralization in a similar pattern to previous can be seen within new lesions.  The dominant lesion is again noted in the RIGHT frontal lobe, approximately 34 x 34 x 29 mm on postcontrast imaging. The RIGHT occipital lesion is redemonstrated, roughly spherical, 2.4 cm in diameter. RIGHT occipital pole lesion more difficult to visualize on today's study, consistent with Decadron effect, slightly decreased in size less than 1 cm diameter. No new lesions are  observed. Moderate vasogenic edema significantly improved.  Chronic microvascular ischemic change is superimposed. Remote LEFT basal ganglia infarct.  Midline shift RIGHT to LEFT of 4 mm, significantly improved. Slight apparent enhancement of the LEFT superior frontal gyrus seen only on coronal image 28 series 17 is not confirmed on the other postcontrast series and is likely artifactual.  IMPRESSION: Brain lab 3T scan demonstrating 3 definite intracranial metastases, with the dominant lesion in the RIGHT frontal lobe. See discussion above. These appear slightly decreased in size compared with 07/04/14, likely Decadron affect.   Electronically Signed   By: Rolla Flatten M.D.   On: 07/15/2014 14:15   Nm Pet Image Initial (pi) Skull Base To Thigh  07/22/2014   CLINICAL DATA:  Initial treatment strategy for lung mass. PATIENT WITH BRAIN METASTASIS.  EXAM: NUCLEAR MEDICINE PET SKULL BASE TO THIGH  TECHNIQUE: 6.8 MCi F-18 FDG was injected intravenously. Full-ring PET imaging was performed from the skull base to thigh after the radiotracer.  CT data was obtained and used for attenuation correction and anatomic localization.  FASTING BLOOD GLUCOSE:  Value: 297 mg/dl  COMPARISON:  CT thorax 07/03/2014  FINDINGS: NECK  No hypermetabolic nodes in the neck.  CHEST  WITHIN THE LEFT LOWER LOBE, 4.2 X 3.7 CM CAVITARY LESION WITH A THICK NODULAR RIM HAS MILD METABOLIC ACTIVITY WITH SUV MAX = 2.6. Within the left upper lobe there is nodular thickening measuring 1.5 cm on the background of severe centrilobular emphysema. This also has mild metabolic activity with SUV max equal 1.8.  There is intense hypermetabolic left hilar mass / lymph node measuring approximately 2.7 cm with SUV max equal 5.2. No additional metastatic mediastinal adenopathy.  ABDOMEN/PELVIS  Within the right hepatic lobe (segment V) there is a hypermetabolic focus (image 188 of the fused data set) which corresponds to a 14 mm low-density lesion on image 138 of  CT series.  Within the cecum there is a rounded mass lesion measuring 4.6 x 4.5 cm which has a intense peripheral metabolic activity with SUV max equal 8.0. Posterior to the cecal mass is a hypermetabolic focus in the iliac fossa which corresponds to the small soft tissue nodule measuring 10 mm. This may represent a metastatic lymph node.  SKELETON  No focal hypermetabolic activity to suggest skeletal metastasis.  IMPRESSION: 1. Hypermetabolic cecal mass is concerning for primary colorectal carcinoma. 2. Potential local metastatic lymph node in right iliac fossa 3. Single hypermetabolic liver metastasis. 4. Hypermetabolic cavitary mass in the left lower lobe and hypermetabolic nodular thickening in the left upper lobe are concerning for metastatic disease versus primary malignancy. Recommend pathologic correlation. 5. Hypermetabolic left hilar nodal mass. Findings conveyed toMOHAMED Malikye Reppond on 07/22/2014  at09:48.   Electronically Signed   By: Suzy Bouchard M.D.   On: 07/22/2014 09:47   Ct Biopsy  07/07/2014   CLINICAL DATA:  Liver lower lobe lung mass  EXAM: CT-GUIDED BIOPSY OF A LEFT LOWER LOBE LUNG MASS.  CORE.  MEDICATIONS AND MEDICAL HISTORY: Versed 1 mg, Fentanyl 50 mcg.  Additional Medications: None.  ANESTHESIA/SEDATION: Moderate sedation time: 5 minutes  PROCEDURE: The procedure, risks, benefits, and alternatives were explained to the patient. Questions regarding the procedure were encouraged and answered. The patient understands and consents to the procedure.  The posterior thorax was prepped with Betadine in a sterile fashion, and a sterile drape was applied covering the operative field. A sterile gown and sterile gloves were used for the procedure.  Under CT guidance, a(n) 17 gauge guide needle was advanced into the cavitary left lower lobe lung mass. Subsequently 3 18 gauge core biopsies were obtained. The guide needle was removed. Final imaging was performed.  Patient tolerated the procedure  well without complication. Vital sign monitoring by nursing staff during the procedure will continue as patient is in the special procedures unit for post procedure observation.  FINDINGS: The images document guide needle placement within the left lower lobe lung mass. Post biopsy images demonstrate no pneumothorax.  COMPLICATIONS: None  IMPRESSION: Successful CT-guided core biopsy of a left lower lobe lung mass.   Electronically Signed   By: Maryclare Bean M.D.   On: 07/07/2014 15:05    ASSESSMENT AND PLAN: This is a very pleasant 68 years old white male with metastatic non-small cell lung cancer, with squamous and glandular differentiation. He is status post stereotactic radiotherapy to several brain lesions and addition to right frontal craniotomy and resection of brain metastasis. His recent MRI showed new brain lesion  and he is scheduled for repeat stereotactic radiotherapy on 08/18/2014. The recent PET scan showed hypermetabolic cecal mass concerning for primary colorectal carcinoma. I discussed the PET scan with the patient and his wife and recommended for him to see gastroenterology for evaluation and consideration of colonoscopy. Regarding his systemic chemotherapy, I will delay the start of any systemic treatment until evaluation for the cecal mass as well as completion of the stereotactic radiation to the brain. I would see the patient back for follow-up visit in 2 weeks for reevaluation and more detailed discussion of his systemic treatment options. He was seen by the dietitian at the [facility] today for evaluation and recommendation regarding his weight loss. The patient was advised to call immediately if he has any concerning symptoms in the interval. The patient voices understanding of current disease status and treatment options and is in agreement with the current care plan.  All questions were answered. The patient knows to call the clinic with any problems, questions or concerns. We can  certainly see the patient much sooner if necessary.  Disclaimer: This note was dictated with voice recognition software. Similar sounding words can inadvertently be transcribed and may not be corrected upon review.

## 2014-08-06 ENCOUNTER — Encounter: Payer: Self-pay | Admitting: Gastroenterology

## 2014-08-12 ENCOUNTER — Ambulatory Visit (INDEPENDENT_AMBULATORY_CARE_PROVIDER_SITE_OTHER): Payer: Medicare HMO | Admitting: Physician Assistant

## 2014-08-12 ENCOUNTER — Encounter: Payer: Self-pay | Admitting: Physician Assistant

## 2014-08-12 VITALS — BP 104/60 | HR 60 | Ht 70.0 in | Wt 123.6 lb

## 2014-08-12 DIAGNOSIS — K6389 Other specified diseases of intestine: Secondary | ICD-10-CM

## 2014-08-12 MED ORDER — MOVIPREP 100 G PO SOLR: 1.0000 | Freq: Once | ORAL | Status: DC

## 2014-08-12 NOTE — Progress Notes (Signed)
I reviewed previous images, it does appear that the cecal mass was present on CT scan 06/2014.  Will plan on colonoscopy at his soonest convenience.

## 2014-08-12 NOTE — Patient Instructions (Signed)

## 2014-08-12 NOTE — Progress Notes (Signed)
Patient ID: Anthony Lopez, male   DOB: 05-04-1946, 69 y.o.   MRN: [id number]    HPI:    . Anthony Lopez is a 69 year old male referred for evaluation by Dr. Earlie Server due to recent findings of a cecal mass on PET scan.  There is a 47 year old gentleman with a history of tobacco abuse who in November began to experience headaches and difficulty with gait. He was walking into objects. He was seen by his PCP and given headache medications which provided no relief. He was sent for CT of the head that showed at least 2 right sided intracerebral lesions and his chest CT showed what appeared like a cavitary lung mass biopsy of the lung mass revealed non-small cell lung cancer with squamous and glandular differentiation.. On December 16 he underwent a craniotomy with removal of a brain mass. He had 1 treatment of radiation prior to surgery and is scheduled for a second radiation treatment in several weeks. He recently was sent for a PET scan and was found to have hot hypermetabolic cecal mass concerning for primary colorectal carcinoma he was advised to be seen in this office for consideration of colonoscopy. He has not had any prior colonoscopies. He denies any change in his bowel habits or stool caliber. He has not had any bloody or tarry stools. He has been losing weight since November.   Past Medical History  Diagnosis Date  . CVA (cerebral infarction) 2000  . HTN (hypertension), benign   . Rheumatic fever     Age 50 or 56  . Tobacco abuse   . Lung cancer   . Brain cancer   . Anxiety   . Headache   . Dizziness   . LBBB (left bundle branch block)   . Bilateral carotid bruits   . Subclavian artery stenosis     Past Surgical History  Procedure Laterality Date  . Transthoracic echocardiogram  January 2004    Normal LV size and function. EF 55-65%.  . Carotid dopplers Bilateral 07/18/2013    Right subclavian 0-49%, left subclavian mild bilateral carotid stenosis..  . Nm myoview ltd  08/06/2013   LOW-RISK; Fixed septal defect consistent with Left Bundle Branch Block related artifact due to septal dyssynergy.   . Mohs surgery      h/o NMSC (scc or bcc)  . Nose surgery      cancer removed  . Hernia repair    . Craniotomy Right 07/23/2014    Procedure: RIGHT CRANIOTOMY FOR TUMOR WITH CURVE;  Surgeon: Eustace Moore, MD;  Location: [facility];  Service: Neurosurgery;  Laterality: Right;  RIGHT CRANIOTOMY FOR TUMOR WITH CURVE   Family History  Problem Relation Age of Onset  . Diabetes Mother   . Diabetes Father   . Heart attack Father   . Skin cancer Mother   . Hypertension Daughter    History  Substance Use Topics  . Smoking status: Former Smoker -- 1.00 packs/day for 50 years    Types: Cigarettes    Quit date: 07/23/2014  . Smokeless tobacco: Never Used     Comment: Smokes < a pack per day now.  . Alcohol Use: No   Current Outpatient Prescriptions  Medication Sig Dispense Refill  . aspirin-acetaminophen-caffeine (EXCEDRIN MIGRAINE) 250-250-65 MG per tablet Take 1 tablet by mouth every 6 (six) hours as needed for headache.    . lisinopril (PRINIVIL,ZESTRIL) 10 MG tablet Take 10 mg by mouth daily.    . Omega-3 Fatty Acids (FISH OIL)  1000 MG CAPS Take by mouth.     No current facility-administered medications for this visit.   Allergies  Allergen Reactions  . Doxycycline     Stay asleep   . Penicillins Rash    Childhood reaction     Review of Systems: Gen: Denies any fever, chills, sweats CV: Denies chest pain, angina, palpitations, syncope, orthopnea, PND, peripheral edema, and claudication. Resp: Denies dyspnea at rest, dyspnea with exercise, cough, sputum, wheezing, coughing up blood, and pleurisy. GI: Denies vomiting blood, jaundice, and fecal incontinence.   Denies dysphagia or odynophagia. GU : Denies urinary burning, blood in urine, urinary frequency, urinary hesitancy, nocturnal urination, and urinary incontinence. MS: Denies joint pain, limitation of  movement, and swelling, stiffness, low back pain, extremity pain. Denies muscle weakness, cramps, atrophy.  Derm: Denies rash, itching, dry skin, hives, moles, warts, or unhealing ulcers.  Psych: Denies depression, anxiety, memory loss, suicidal ideation, hallucinations, paranoia, and confusion. Heme: Denies bruising, bleeding, and enlarged lymph nodes. Neuro:  Denies any headaches, dizziness, paresthesias. Endo:  Denies any problems with DM, thyroid, adrenal function  Studies: Mr Kizzie Fantasia Contrast  August 12, 2014   CLINICAL DATA:  Lung cancer with brain metastases. Postop day 1 right foraminal brain metastasis resection.  EXAM: MRI HEAD WITHOUT AND WITH CONTRAST  TECHNIQUE: Multiplanar, multiecho pulse sequences of the brain and surrounding structures were obtained without and with intravenous contrast.  CONTRAST:  67mL MULTIHANCE GADOBENATE DIMEGLUMINE 529 MG/ML IV SOLN  COMPARISON:  07/15/2014  FINDINGS: There is a 9 mm focus of restricted diffusion involving anterior left frontal cortex on coronal diffusion images (series 7 images 30 and 31). Sequelae of interval right frontal craniotomy and right frontal mass resection are identified. Minimal right frontal extra-axial fluid/ hematoma is noted. Small amount of pneumocephalus is noted. There is also trace blood in the atrium/ occipital horn of the left lateral ventricle.  Vasogenic edema in the right frontal lobe has decreased from the prior study. Blood products are present in the right frontal resection cavity with associated intrinsic T1 hyperintensity. Mild restricted diffusion at the margins of the cavity is likely postoperative. There is likely minimal enhancement at the margins of the cavity without clear nodular enhancement identified, however evaluation is mildly limited by motion artifact, blood products, and different scanning technique between the pre and postcontrast axial T1 images.  Ring-enhancing right parietal lesion measures 2.9 x 2.4  cm, unchanged. Surrounding vasogenic edema has slightly decreased. 1.1 cm right occipital lesion does not appear significantly changed. There is a 3 mm punctate focus of cortical enhancement in the right frontoparietal region near the vertex which was not clearly present on the prior studies (series 11, image 21 and series 12, image 14). There is no associated edema.  Apparent enhancement in the left superior temporal gyrus on the prior study is no longer seen and was likely artifactual as previously suggested. Leftward midline shift is noted. Chronic left greater than right basal ganglia and bilateral thalamic lacunar infarcts are again seen.  Orbits are unremarkable. Focal right posterior ethmoid air cell mucosal thickening is noted. Mastoid air cells are clear. Major intracranial vascular flow voids are preserved.  IMPRESSION: 1. Interval right frontal mass resection with minimal enhancement at the margins of the cavity as above. Decreased adjacent edema. 2. Unchanged right parietal and right occipital lesions. 3. New 3 mm focus of enhancement in the right frontoparietal region, possibly an additional metastasis. 4. Subcentimeter acute left frontal cortical infarct.   Electronically Signed  By: Logan Bores   On: 07/24/2014 12:34   Mr Brain W Wo Contrast  07/15/2014   CLINICAL DATA:  Lung cancer with suspected metastases. Brain lab.  EXAM: MRI HEAD WITHOUT AND WITH CONTRAST  TECHNIQUE: Multiplanar, multiecho pulse sequences of the brain and surrounding structures were obtained without and with intravenous contrast.  CONTRAST:  27mL MULTIHANCE GADOBENATE DIMEGLUMINE 529 MG/ML IV SOLN  COMPARISON:  07/04/2014 MR head.  FINDINGS: Redemonstrated is moderate cerebral edema associated with RIGHT hemisphere ring enhancing mass lesions, suspected brain metastases from lung cancer. Central restricted diffusion of some but, but not all of the lesions, unlikely to represent abscesses. Some blood products or  mineralization in a similar pattern to previous can be seen within new lesions.  The dominant lesion is again noted in the RIGHT frontal lobe, approximately 34 x 34 x 29 mm on postcontrast imaging. The RIGHT occipital lesion is redemonstrated, roughly spherical, 2.4 cm in diameter. RIGHT occipital pole lesion more difficult to visualize on today's study, consistent with Decadron effect, slightly decreased in size less than 1 cm diameter. No new lesions are observed. Moderate vasogenic edema significantly improved.  Chronic microvascular ischemic change is superimposed. Remote LEFT basal ganglia infarct.  Midline shift RIGHT to LEFT of 4 mm, significantly improved. Slight apparent enhancement of the LEFT superior frontal gyrus seen only on coronal image 28 series 17 is not confirmed on the other postcontrast series and is likely artifactual.  IMPRESSION: Brain lab 3T scan demonstrating 3 definite intracranial metastases, with the dominant lesion in the RIGHT frontal lobe. See discussion above. These appear slightly decreased in size compared with 07/04/14, likely Decadron affect.   Electronically Signed   By: Rolla Flatten M.D.   On: 07/15/2014 14:15   Nm Pet Image Initial (pi) Skull Base To Thigh  07/22/2014   CLINICAL DATA:  Initial treatment strategy for lung mass. PATIENT WITH BRAIN METASTASIS.  EXAM: NUCLEAR MEDICINE PET SKULL BASE TO THIGH  TECHNIQUE: 6.8 MCi F-18 FDG was injected intravenously. Full-ring PET imaging was performed from the skull base to thigh after the radiotracer. CT data was obtained and used for attenuation correction and anatomic localization.  FASTING BLOOD GLUCOSE:  Value: 297 mg/dl  COMPARISON:  CT thorax 07/03/2014  FINDINGS: NECK  No hypermetabolic nodes in the neck.  CHEST  WITHIN THE LEFT LOWER LOBE, 4.2 X 3.7 CM CAVITARY LESION WITH A THICK NODULAR RIM HAS MILD METABOLIC ACTIVITY WITH SUV MAX = 2.6. Within the left upper lobe there is nodular thickening measuring 1.5 cm on the  background of severe centrilobular emphysema. This also has mild metabolic activity with SUV max equal 1.8.  There is intense hypermetabolic left hilar mass / lymph node measuring approximately 2.7 cm with SUV max equal 5.2. No additional metastatic mediastinal adenopathy.  ABDOMEN/PELVIS  Within the right hepatic lobe (segment V) there is a hypermetabolic focus (image 259 of the fused data set) which corresponds to a 14 mm low-density lesion on image 138 of CT series.  Within the cecum there is a rounded mass lesion measuring 4.6 x 4.5 cm which has a intense peripheral metabolic activity with SUV max equal 8.0. Posterior to the cecal mass is a hypermetabolic focus in the iliac fossa which corresponds to the small soft tissue nodule measuring 10 mm. This may represent a metastatic lymph node.  SKELETON  No focal hypermetabolic activity to suggest skeletal metastasis.  IMPRESSION: 1. Hypermetabolic cecal mass is concerning for primary colorectal carcinoma. 2. Potential local  metastatic lymph node in right iliac fossa 3. Single hypermetabolic liver metastasis. 4. Hypermetabolic cavitary mass in the left lower lobe and hypermetabolic nodular thickening in the left upper lobe are concerning for metastatic disease versus primary malignancy. Recommend pathologic correlation. 5. Hypermetabolic left hilar nodal mass. Findings conveyed toMOHAMED MOHAMED on 07/22/2014  at09:48.   Electronically Signed   By: Suzy Bouchard M.D.   On: 07/22/2014 09:47   LABS: CBC on 08/05/2014 had a white blood cell count 10.7, hemoglobin 11.9, hematocrit 36, platelets 200,000. Comprehensive metabolic panel on 11/65/7903 had total bili 0.35, alkaline phosphatase 89, AST 10, ALT 19.  Physical Exam: BP 104/60 mmHg  Pulse 60  Ht 5\' 10"  (1.778 m)  Wt 123 lb 9.6 oz (56.065 kg)  BMI 17.73 kg/m2 Constitutional: Pleasant,well-developed thin ill appearing male in no acute distress. HEENT: Normocephalic and atraumatic. Conjunctivae are  normal. No scleral icterus. Neck supple.  Cardiovascular: Normal rate, regular rhythm.  Pulmonary/chest: Effort normal and breath sounds normal. No wheezing, rales or rhonchi. Abdominal: Soft, nondistended, nontender. Bowel sounds active throughout. There are no masses palpable. No hepatomegaly. Extremities: no edema Lymphadenopathy: No cervical adenopathy noted. Neurological: Alert and oriented to person place and time. Skin: Skin is warm and dry. No rashes noted. Psychiatric: Normal mood and affect. Behavior is normal.  ASSESSMENT AND PLAN: 69 year old male with metastatic non-small cell lung cancer with squama showed glandular differentiation, status post stereotactic radiotherapy to several brain lesions and right frontal craniotomy and resection of brain metastasis now with PET scan revealing hypermetabolic cecal mass concerning for primary colorectal carcinoma. He has been advised to undergo colonoscopy for further evaluation of this lesion.The risks, benefits, and alternatives to colonoscopy with possible biopsy and possible polypectomy were discussed with the patient and they consent to proceed.     Elvis Laufer, Vita Barley PA-C 08/12/2014, 8:23 AM

## 2014-08-15 ENCOUNTER — Telehealth: Payer: Self-pay | Admitting: Radiation Oncology

## 2014-08-15 ENCOUNTER — Encounter: Payer: Self-pay | Admitting: Radiation Oncology

## 2014-08-15 NOTE — Telephone Encounter (Signed)
Received email from [employer]. Phoned number she provided on email to discuss Mr. Anthony Lopez's weakness and decreased appetite. No answer. Left message requesting return call.

## 2014-08-18 ENCOUNTER — Encounter: Payer: Self-pay | Admitting: Radiation Oncology

## 2014-08-18 ENCOUNTER — Ambulatory Visit
Admission: RE | Admit: 2014-08-18 | Discharge: 2014-08-18 | Disposition: A | Discharge status: Home / Self Care | Payer: Medicare HMO | Source: Ambulatory Visit | Attending: Radiation Oncology | Admitting: Radiation Oncology

## 2014-08-18 VITALS — BP 103/51 | HR 83 | Resp 16 | Wt 123.6 lb

## 2014-08-18 DIAGNOSIS — C7931 Secondary malignant neoplasm of brain: Secondary | ICD-10-CM

## 2014-08-18 MED ORDER — DEXAMETHASONE 4 MG PO TABS: 4.0000 mg | ORAL_TABLET | Freq: Two times a day (BID) | ORAL | Status: AC

## 2014-08-18 NOTE — Progress Notes (Signed)
Reports constant "busting" headaches 10 on a scale of 0-10. Reports decreased appetite. Unsteady gait noted. BP low. Weight stable. Reports that he feels weak. Patient reports fatigue. Reports inability to sleep at night due to headaches. Reports occasional confusion. Denies taking decadron at this time. Noted to be disoriented. Reports persistent nausea denies emesis. Denies ringing in the ears or diplopia.

## 2014-08-18 NOTE — Progress Notes (Signed)
Radiation Oncology         [phone]   Name: Anthony Lopez   Date: 08/18/2014   MRN: [id number]  DOB: 12-18-1945    Multidisciplinary Brain and Spine Oncology Clinic Follow-Up Visit Note  CC: Woody Seller, MD  Annia Belt, MD    ICD-9-CM ICD-10-CM   1. 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  The recent films were presented in our multidisciplinary conference with neuroradiology just prior to the clinic.  Reports constant "busting" headaches 10 on a scale of 0-10. Reports decreased appetite. Unsteady gait noted. BP low. Weight stable. Reports that he feels weak. Patient reports fatigue. Reports inability to sleep at night due to headaches. Reports occasional confusion. Denies taking decadron at this time. Noted to be disoriented. Reports persistent nausea denies emesis. Denies ringing in the ears or diplopia.                               ALLERGIES:  is allergic to doxycycline and penicillins.  Meds: Current Outpatient Prescriptions  Medication Sig Dispense Refill  . acetaminophen (TYLENOL) 325 MG tablet Take 650 mg by mouth every 6 (six) hours as needed.    Marland Kitchen lisinopril (PRINIVIL,ZESTRIL) 10 MG tablet Take 10 mg by mouth daily.    . Omega-3 Fatty Acids (FISH OIL) 1000 MG CAPS Take by mouth.    Marland Kitchen aspirin-acetaminophen-caffeine (EXCEDRIN MIGRAINE) 250-250-65 MG per tablet Take 1 tablet by mouth every 6 (six) hours as needed for headache.    Marland Kitchen aspirin-acetaminophen-caffeine (EXCEDRIN MIGRAINE) 250-250-65 MG per tablet     . cloNIDine (CATAPRES) 0.1 MG tablet     . dexamethasone (DECADRON) 1 MG tablet   0  . dexamethasone (DECADRON) 4 MG tablet     . doxycycline (VIBRAMYCIN) 100 MG capsule     . fluticasone (FLONASE) 50 MCG/ACT nasal spray     .  HYDROcodone-acetaminophen (NORCO/VICODIN) 5-325 MG per tablet   0  . MOVIPREP 100 G SOLR Take 1 kit (200 g total) by mouth once. (Patient not taking: Reported on 08/18/2014) 1 kit 0  . nicotine (NICODERM CQ - DOSED IN MG/24 HOURS) 21 mg/24hr patch     . Phenylephrine-Acetaminophen 5-325 MG TABS      No current facility-administered medications for this encounter.    Physical Findings: The patient is in no acute distress. Patient is alert and oriented.  weight is 123 lb 9.6 oz (56.065 kg). His blood pressure is 103/51 and his pulse is 83. His respiration is 16. .  No significant changes.  Lab Findings: Lab Results  Component Value Date   WBC 10.7* 08/05/2014   HGB 11.9* 08/05/2014   HCT 36.0* 08/05/2014   MCV 93.4 08/05/2014   PLT 200 08/05/2014    @LASTCHEM @  Radiographic Findings: [name] Contrast  07/24/2014   CLINICAL DATA:  Lung cancer with brain metastases. Postop day 1 right foraminal brain metastasis resection.  EXAM: MRI HEAD WITHOUT AND WITH CONTRAST  TECHNIQUE: Multiplanar, multiecho pulse sequences of the brain and surrounding structures were obtained without and with intravenous contrast.  CONTRAST:  21m MULTIHANCE GADOBENATE DIMEGLUMINE 529 MG/ML IV SOLN  COMPARISON:  07/15/2014  FINDINGS: There is a  9 mm focus of restricted diffusion involving anterior left frontal cortex on coronal diffusion images (series 7 images 30 and 31). Sequelae of interval right frontal craniotomy and right frontal mass resection are identified. Minimal right frontal extra-axial fluid/ hematoma is noted. Small amount of pneumocephalus is noted. There is also trace blood in the atrium/ occipital horn of the left lateral ventricle.  Vasogenic edema in the right frontal lobe has decreased from the prior study. Blood products are present in the right frontal resection cavity with associated intrinsic T1 hyperintensity. Mild restricted diffusion at the margins of the cavity is likely postoperative.  There is likely minimal enhancement at the margins of the cavity without clear nodular enhancement identified, however evaluation is mildly limited by motion artifact, blood products, and different scanning technique between the pre and postcontrast axial T1 images.  Ring-enhancing right parietal lesion measures 2.9 x 2.4 cm, unchanged. Surrounding vasogenic edema has slightly decreased. 1.1 cm right occipital lesion does not appear significantly changed. There is a 3 mm punctate focus of cortical enhancement in the right frontoparietal region near the vertex which was not clearly present on the prior studies (series 11, image 21 and series 12, image 14). There is no associated edema.  Apparent enhancement in the left superior temporal gyrus on the prior study is no longer seen and was likely artifactual as previously suggested. Leftward midline shift is noted. Chronic left greater than right basal ganglia and bilateral thalamic lacunar infarcts are again seen.  Orbits are unremarkable. Focal right posterior ethmoid air cell mucosal thickening is noted. Mastoid air cells are clear. Major intracranial vascular flow voids are preserved.  IMPRESSION: 1. Interval right frontal mass resection with minimal enhancement at the margins of the cavity as above. Decreased adjacent edema. 2. Unchanged right parietal and right occipital lesions. 3. New 3 mm focus of enhancement in the right frontoparietal region, possibly an additional metastasis. 4. Subcentimeter acute left frontal cortical infarct.   Electronically Signed   By: Logan Bores   On: 07/24/2014 12:34   Nm Pet Image Initial (pi) Skull Base To Thigh  07/22/2014   CLINICAL DATA:  Initial treatment strategy for lung mass. PATIENT WITH BRAIN METASTASIS.  EXAM: NUCLEAR MEDICINE PET SKULL BASE TO THIGH  TECHNIQUE: 6.8 MCi F-18 FDG was injected intravenously. Full-ring PET imaging was performed from the skull base to thigh after the radiotracer. CT data was obtained  and used for attenuation correction and anatomic localization.  FASTING BLOOD GLUCOSE:  Value: 297 mg/dl  COMPARISON:  CT thorax 07/03/2014  FINDINGS: NECK  No hypermetabolic nodes in the neck.  CHEST  WITHIN THE LEFT LOWER LOBE, 4.2 X 3.7 CM CAVITARY LESION WITH A THICK NODULAR RIM HAS MILD METABOLIC ACTIVITY WITH SUV MAX = 2.6. Within the left upper lobe there is nodular thickening measuring 1.5 cm on the background of severe centrilobular emphysema. This also has mild metabolic activity with SUV max equal 1.8.  There is intense hypermetabolic left hilar mass / lymph node measuring approximately 2.7 cm with SUV max equal 5.2. No additional metastatic mediastinal adenopathy.  ABDOMEN/PELVIS  Within the right hepatic lobe (segment V) there is a hypermetabolic focus (image 202 of the fused data set) which corresponds to a 14 mm low-density lesion on image 138 of CT series.  Within the cecum there is a rounded mass lesion measuring 4.6 x 4.5 cm which has a intense peripheral metabolic activity with SUV max equal 8.0. Posterior to the cecal mass is a hypermetabolic  focus in the iliac fossa which corresponds to the small soft tissue nodule measuring 10 mm. This may represent a metastatic lymph node.  SKELETON  No focal hypermetabolic activity to suggest skeletal metastasis.  IMPRESSION: 1. Hypermetabolic cecal mass is concerning for primary colorectal carcinoma. 2. Potential local metastatic lymph node in right iliac fossa 3. Single hypermetabolic liver metastasis. 4. Hypermetabolic cavitary mass in the left lower lobe and hypermetabolic nodular thickening in the left upper lobe are concerning for metastatic disease versus primary malignancy. Recommend pathologic correlation. 5. Hypermetabolic left hilar nodal mass. Findings conveyed toMOHAMED MOHAMED on 07/22/2014  at09:48.   Electronically Signed   By: Suzy Bouchard M.D.   On: 07/22/2014 09:47    Impression:  The patient is recovering from the effects of  radiation.    Plan:  Restart dexamethasone 4 mg BID, then MRI in 2 months and follow-up  _____________________________________  Sheral Apley Tammi Klippel, M.D.

## 2014-08-21 ENCOUNTER — Telehealth: Payer: Self-pay | Admitting: Internal Medicine

## 2014-08-21 ENCOUNTER — Other Ambulatory Visit: Payer: Medicare HMO

## 2014-08-21 ENCOUNTER — Ambulatory Visit: Payer: Medicare HMO | Admitting: Physician Assistant

## 2014-08-21 NOTE — Telephone Encounter (Signed)
s.w pt and r/s appt per pt request due to not feeling well.Marland KitchenMarland KitchenMarland KitchenMarland Kitchenpt ok and aware of new d.t

## 2014-08-22 ENCOUNTER — Emergency Department (EMERGENCY_DEPARTMENT_HOSPITAL)
Admission: EM | Admit: 2014-08-22 | Discharge: 2014-08-22 | Disposition: A | Discharge status: Home / Self Care | Payer: Medicare HMO | Source: Home / Self Care | Attending: Emergency Medicine | Admitting: Emergency Medicine

## 2014-08-22 ENCOUNTER — Ambulatory Visit: Payer: Medicare HMO | Admitting: Gastroenterology

## 2014-08-22 ENCOUNTER — Encounter (HOSPITAL_COMMUNITY): Payer: Self-pay | Admitting: Emergency Medicine

## 2014-08-22 ENCOUNTER — Emergency Department (HOSPITAL_COMMUNITY): Payer: Medicare HMO

## 2014-08-22 ENCOUNTER — Telehealth: Payer: Self-pay | Admitting: *Deleted

## 2014-08-22 DIAGNOSIS — Z8659 Personal history of other mental and behavioral disorders: Secondary | ICD-10-CM | POA: Insufficient documentation

## 2014-08-22 DIAGNOSIS — R739 Hyperglycemia, unspecified: Secondary | ICD-10-CM | POA: Diagnosis not present

## 2014-08-22 DIAGNOSIS — R079 Chest pain, unspecified: Secondary | ICD-10-CM | POA: Diagnosis not present

## 2014-08-22 DIAGNOSIS — Z79899 Other long term (current) drug therapy: Secondary | ICD-10-CM | POA: Insufficient documentation

## 2014-08-22 DIAGNOSIS — Z87891 Personal history of nicotine dependence: Secondary | ICD-10-CM | POA: Insufficient documentation

## 2014-08-22 DIAGNOSIS — C3490 Malignant neoplasm of unspecified part of unspecified bronchus or lung: Secondary | ICD-10-CM | POA: Insufficient documentation

## 2014-08-22 DIAGNOSIS — Z85841 Personal history of malignant neoplasm of brain: Secondary | ICD-10-CM | POA: Insufficient documentation

## 2014-08-22 DIAGNOSIS — Z8673 Personal history of transient ischemic attack (TIA), and cerebral infarction without residual deficits: Secondary | ICD-10-CM | POA: Insufficient documentation

## 2014-08-22 DIAGNOSIS — R0600 Dyspnea, unspecified: Secondary | ICD-10-CM

## 2014-08-22 DIAGNOSIS — E871 Hypo-osmolality and hyponatremia: Secondary | ICD-10-CM

## 2014-08-22 DIAGNOSIS — Z85038 Personal history of other malignant neoplasm of large intestine: Secondary | ICD-10-CM | POA: Insufficient documentation

## 2014-08-22 DIAGNOSIS — R933 Abnormal findings on diagnostic imaging of other parts of digestive tract: Secondary | ICD-10-CM

## 2014-08-22 DIAGNOSIS — I10 Essential (primary) hypertension: Secondary | ICD-10-CM | POA: Insufficient documentation

## 2014-08-22 DIAGNOSIS — Z7952 Long term (current) use of systemic steroids: Secondary | ICD-10-CM | POA: Insufficient documentation

## 2014-08-22 DIAGNOSIS — Z880 Allergy status to penicillin: Secondary | ICD-10-CM | POA: Insufficient documentation

## 2014-08-22 HISTORY — DX: Secondary malignant neoplasm of large intestine and rectum: C78.5

## 2014-08-22 HISTORY — DX: Secondary malignant neoplasm of liver and intrahepatic bile duct: C78.7

## 2014-08-22 LAB — CBC WITH DIFFERENTIAL/PLATELET
BASOS ABS: 0 10*3/uL (ref 0.0–0.1)
Basophils Relative: 0 % (ref 0–1)
Eosinophils Absolute: 0.1 10*3/uL (ref 0.0–0.7)
Eosinophils Relative: 0 % (ref 0–5)
HCT: 33.2 % — ABNORMAL LOW (ref 39.0–52.0)
HEMOGLOBIN: 11.4 g/dL — AB (ref 13.0–17.0)
Lymphocytes Relative: 9 % — ABNORMAL LOW (ref 12–46)
Lymphs Abs: 1.4 10*3/uL (ref 0.7–4.0)
MCH: 31.4 pg (ref 26.0–34.0)
MCHC: 34.3 g/dL (ref 30.0–36.0)
MCV: 91.5 fL (ref 78.0–100.0)
MONO ABS: 0.8 10*3/uL (ref 0.1–1.0)
Monocytes Relative: 5 % (ref 3–12)
NEUTROS PCT: 86 % — AB (ref 43–77)
Neutro Abs: 14.6 10*3/uL — ABNORMAL HIGH (ref 1.7–7.7)
PLATELETS: 195 10*3/uL (ref 150–400)
RBC: 3.63 MIL/uL — ABNORMAL LOW (ref 4.22–5.81)
RDW: 14.5 % (ref 11.5–15.5)
WBC: 16.9 10*3/uL — ABNORMAL HIGH (ref 4.0–10.5)

## 2014-08-22 LAB — BASIC METABOLIC PANEL
Anion gap: 12 (ref 5–15)
BUN: 27 mg/dL — AB (ref 6–23)
CHLORIDE: 89 meq/L — AB (ref 96–112)
CO2: 25 mmol/L (ref 19–32)
Calcium: 8.9 mg/dL (ref 8.4–10.5)
Creatinine, Ser: 0.8 mg/dL (ref 0.50–1.35)
GFR calc Af Amer: >90 mL/min (ref >90)
GFR calc non Af Amer: >90 mL/min (ref >90)
Glucose, Bld: 364 mg/dL — ABNORMAL HIGH (ref 70–99)
POTASSIUM: 4.4 mmol/L (ref 3.5–5.1)
Sodium: 126 mmol/L — ABNORMAL LOW (ref 135–145)

## 2014-08-22 LAB — TROPONIN I

## 2014-08-22 MED ORDER — SODIUM CHLORIDE 0.9 % IV BOLUS (SEPSIS)
500.0000 mL | Freq: Once | INTRAVENOUS | Status: AC
  Administered 2014-08-22: 500 mL via INTRAVENOUS

## 2014-08-22 MED ORDER — IOHEXOL 350 MG/ML SOLN
100.0000 mL | Freq: Once | INTRAVENOUS | Status: AC | PRN
  Administered 2014-08-22: 100 mL via INTRAVENOUS

## 2014-08-22 NOTE — Progress Notes (Signed)
He showed up today with his wife, very weak.  He has been very week for several days, was having trouble breathing last night and "felt like he was going to die."  His wife almost took him to the ER last night. He only drank a very small amount of prep.  I advised him to go to the ER.  I am relatively certain he will be admitted and we can plan to proceed with colonoscopy while he is in hosp.  He has large cecal mass that needs tissue biopsy.

## 2014-08-22 NOTE — ED Notes (Signed)
Bed: MB55 Expected date:  Expected time:  Means of arrival:  Comments: Triage 2

## 2014-08-22 NOTE — ED Notes (Signed)
MD at bedside. 

## 2014-08-22 NOTE — Telephone Encounter (Signed)
Pt's wife called wanting to know whether pt needs to be seen earlier then Friday 2014/09/15.  He is in the ED and they did a CT Angio.  Will forward note to Dr Vista Mink.

## 2014-08-22 NOTE — ED Notes (Signed)
EKG given to dr Darl Householder...klj

## 2014-08-22 NOTE — Consult Note (Signed)
Pt seen and examined, x-rays reviewed.  Full note to follow.  Impression 1.  Weakness, transient SOB while starting bowel prep; w/u underway 2.  Metastatic lung Ca to brain 3.  Probable cecal mass - r/o Ca  Recommendations - Pt is not ready for colonoscopy prep and procedure.  Once resuscitated and acute problems are resolved will schedule procedure.

## 2014-08-22 NOTE — ED Provider Notes (Signed)
CSN: [id number]     Arrival date & time 08/22/14  9379 History   First MD Initiated Contact with Patient 08/22/14 [phone]     Chief Complaint  Patient presents with  . Shortness of Breath  . 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 Earlie Server oncology follows, stage IV cancer presents after episode of chest pain with taking a breath while drinking solution for colonoscopy. Patient is had general weakness associated with his medical conditions however he had pleuritic chest pain and worsening shortness of breath today. This is worse than normal. No cardiac or blood clot history, no unilateral leg swelling.  Patient is a 69 y.o. male presenting with shortness of breath and chest pain. The history is provided by the patient.  Shortness of Breath Associated symptoms: chest pain and cough (chronic)   Associated symptoms: no abdominal pain, no fever, no headaches, no neck pain, no rash and no vomiting   Chest Pain Associated symptoms: cough (chronic), shortness of breath and weakness (general)   Associated symptoms: no abdominal pain, no back pain, no fever, no headache and not vomiting     Past Medical History  Diagnosis Date  . CVA (cerebral infarction) 2000  . HTN (hypertension), benign   . Rheumatic fever     Age 16 or 24  . Tobacco abuse   . Lung cancer   . Brain cancer   . Anxiety   . Headache   . Dizziness   . LBBB (left bundle branch block)   . Bilateral carotid bruits   . Subclavian artery stenosis   . Metastasis to colon   . Metastasis to liver    Past Surgical History  Procedure Laterality Date  . Transthoracic echocardiogram  January 2004    Normal LV size and function. EF 55-65%.  . Carotid dopplers Bilateral 07/18/2013    Right subclavian 0-49%, left  subclavian mild bilateral carotid stenosis..  . 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   . Mohs surgery      h/o NMSC (scc or bcc)  . Nose surgery      cancer removed  . Hernia repair    . Craniotomy Right 07/23/2014    Procedure: RIGHT CRANIOTOMY FOR TUMOR WITH CURVE;  Surgeon: Eustace Moore, MD;  Location: [facility];  Service: Neurosurgery;  Laterality: Right;  RIGHT CRANIOTOMY FOR TUMOR WITH CURVE   Family History  Problem Relation Age of Onset  . Diabetes Mother   . Diabetes Father   . Heart attack Father   . Skin cancer Mother   . Hypertension Daughter    History  Substance Use Topics  . Smoking status: Former Smoker -- 1.00 packs/day for 50 years    Types: Cigarettes    Quit date: 07/23/2014  . Smokeless tobacco: Never Used     Comment: Smokes < a pack per day now.  . Alcohol Use: No    Review of Systems  Constitutional: Positive for appetite change. Negative for fever and chills.  HENT: Negative for congestion.   Eyes: Negative for visual disturbance.  Respiratory: Positive for cough (chronic) and shortness of breath.   Cardiovascular: Positive for chest pain. Negative for leg swelling.  Gastrointestinal: Negative for vomiting and abdominal pain.  Genitourinary: Negative for dysuria and flank pain.  Musculoskeletal: Negative for back pain, neck pain and neck stiffness.  Skin: Negative for rash.  Neurological: Positive for weakness (general). Negative for light-headedness and headaches.      Allergies  Doxycycline and Penicillins  Home Medications   Prior to Admission medications   Medication Sig Start Date End Date Taking? Authorizing Provider  acetaminophen (TYLENOL) 500 MG tablet Take 1,000 mg by mouth every 6 (six) hours as needed for mild pain or moderate pain.   Yes Historical Provider, MD  dexamethasone (DECADRON) 1 MG tablet Take 1 mg by mouth 2  (two) times daily with a meal.   Yes Historical Provider, MD  lisinopril (PRINIVIL,ZESTRIL) 10 MG tablet Take 10 mg by mouth daily.   Yes Historical Provider, MD  Omega-3 Fatty Acids (FISH OIL) 1000 MG CAPS Take 1 capsule by mouth daily.    Yes Historical Provider, MD  dexamethasone (DECADRON) 4 MG tablet Take 1 tablet (4 mg total) by mouth 2 (two) times daily. Patient not taking: Reported on 08/22/2014 08/18/14   Lora Paula, MD  MOVIPREP 100 G SOLR Take 1 kit (200 g total) by mouth once. Patient not taking: Reported on 08/18/2014 08/12/14   Cecille Rubin P Hvozdovic, PA-C   BP 126/77 mmHg  Pulse 70  Temp(Src) 97.8 F (36.6 C) (Oral)  Resp 16  SpO2 98% Physical Exam  Constitutional: He is oriented to person, place, and time. No distress (frail appearance).  HENT:  Head: Normocephalic and atraumatic.  Dry mucous membranes  Eyes: Conjunctivae are normal. Right eye exhibits no discharge. Left eye exhibits no discharge.  Neck: Normal range of motion. Neck supple. No tracheal deviation present.  Cardiovascular: Normal rate and regular rhythm.   Pulmonary/Chest: Effort normal. He has rales (few sparse rales bilateral mid lung).  Abdominal: Soft. He exhibits no distension. There is no tenderness. There is no guarding.  Musculoskeletal: He exhibits no edema.  Neurological: He is alert and oriented to person, place, and time.  Skin: Skin is warm. No rash noted.  Psychiatric: He has a normal mood and affect.  Nursing note and vitals reviewed.   ED Course  Procedures (including critical care time) Labs Review Labs Reviewed  BASIC METABOLIC PANEL - Abnormal; Notable for the following:    Sodium 126 (*)    Chloride 89 (*)    Glucose, Bld 364 (*)    BUN 27 (*)    All other components within normal limits  CBC WITH DIFFERENTIAL - Abnormal; Notable for the following:    WBC 16.9 (*)    RBC 3.63 (*)    Hemoglobin 11.4 (*)    HCT 33.2 (*)    Neutrophils Relative % 86 (*)    Neutro Abs 14.6 (*)     Lymphocytes Relative 9 (*)    All other components within normal limits  TROPONIN I    Imaging Review Ct Angio Chest Pe W/cm &/or Wo Cm  08/22/2014   CLINICAL DATA:  Shortness of breath. History of lung cancer and brain metastasis.  EXAM: CT ANGIOGRAPHY CHEST WITH CONTRAST  TECHNIQUE: Multidetector CT imaging of the chest was performed using the standard protocol during bolus administration of intravenous contrast. Multiplanar CT image reconstructions and MIPs were obtained to evaluate the vascular anatomy.  CONTRAST:  198m OMNIPAQUE IOHEXOL 350 MG/ML SOLN  COMPARISON:  Chest CT 07/03/2014  FINDINGS: Chest wall: No chest wall mass, supraclavicular or axillary lymphadenopathy. The thyroid gland is grossly normal. The bony  thorax is intact. No destructive bone lesions or spinal canal compromise.  Mediastinum: The heart is normal in size. No pericardial effusion. 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 Significant progression of left hilar and mediastinal tumor. The left hilar mass measures approximately 4.6 x 3.8 cm in the subcarinal adenopathy measures 2.6 x 2.1 cm. There is moderate compression of the left lower lobe pulmonary artery. The esophagus is grossly normal.  Lungs: Severe emphysematous changes are again demonstrated. Stable right upper lobe scarring changes. New vague density in the left upper lobe is more likely scarring or atelectasis. The thick walled cavitary lesion in the left lower lobe has enlarged. It previously measured 4.3 x 3.7 cm and now measures 5.1 x 5.0 cm. There is surrounding marked interstitial thickening which could be obstructive pneumonitis or interstitial spread of tumor. New pulmonary nodule at the right lung base is likely metastatic disease.  Upper abdomen:  No significant findings.  Review of the MIP images confirms the above findings.  IMPRESSION: Enlarging cavitary mass in  the superior segment of the left lower lobe with progressive surrounding interstitial changes which could be due to obstructive pneumonitis or interstitial spread of tumor.  Significant progression of mediastinal and left hilar lymphadenopathy with compression of the lower lobe left pulmonary artery.  New pulmonary nodule at the right lower lobe is suspicious for metastasis.  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   Electronically Signed   By: Kalman Jewels M.D.   On: 08/22/2014 13:06   Dg Chest Port 1 View  08/22/2014   CLINICAL DATA:  History of left-sided lung cancer with metastatic disease to the brain. Subsequent encounter. Chest pain with deep breath scarring at 1800 hrs yesterday.  EXAM: PORTABLE CHEST - 1 VIEW  COMPARISON:  07/07/2014.  FINDINGS: 1033 hrs. Cavitary lesion in the left lower lobe has progressed in the interval although the airspace disease peripheral to the lesion has decreased. Multiple skin folds overlie the lateral left lung but no definite left-sided pneumothorax. Right lung shows no edema or focal airspace consolidation. Interstitial markings are diffusely coarsened with chronic features. The cardiopericardial silhouette is within normal limits for size. Telemetry leads overlie the chest.  IMPRESSION: Interval progression of cavitary left lung lesion although airspace disease peripheral to the lesion has decreased since the prior chest x-ray.   Electronically Signed   By: Misty Stanley M.D.   On: 08/22/2014 10:56     EKG Interpretation   Date/Time:  Friday August 22 2014 09:58:13 EST Ventricular Rate:  90 PR Interval:  140 QRS Duration: 136 QT Interval:  398 QTC Calculation: 487 R Axis:   59 Text Interpretation:  Sinus rhythm IVCD, consider atypical LBBB Baseline  wander in lead(s) I II aVR overall similar to previous poor baseline  Confirmed by Helaina Stefano  MD, Averee Harb ([id number]) on 08/22/2014 10:42:00 AM      MDM   Final diagnoses:  Dyspnea   Malignant neoplasm of lung, unspecified laterality, unspecified part of lung  Hyponatremia   Patient presents after episode of slurred chest pain and worsening shortness of breath. With active cancer history concern for effusion versus pulmonary embolus. Blood work and CT angina chest  CT results reviewed showing worsening of patient's lung cancer. New nodule seen. Patient comfortable on recheck not requiring auction no active pain. Patient tolerating a meal without difficulty. Patient on oral steroids. Paged oncology however unable  to get a hold of oncology. Discussed close follow-up outpatient, patient will call oncology primary doctor in the next 2 days. WBC elevated, no obvious source, pt is on steroids. Pneumonitis.   Results and differential diagnosis were discussed with the patient/parent/guardian. Close follow up outpatient was discussed, comfortable with the plan.   Medications  iohexol (OMNIPAQUE) 350 MG/ML injection 100 mL (100 mLs Intravenous Contrast Given 08/22/14 1159)  sodium chloride 0.9 % bolus 500 mL (0 mLs Intravenous Stopped 08/22/14 1250)    Filed Vitals:   08/22/14 0959 08/22/14 1230 08/22/14 1249 08/22/14 1319  BP: 135/84  137/67 126/77  Pulse: 92 63 63 70  Temp: 97.8 F (36.6 C)     TempSrc: Oral     Resp: 22 16 15 16   SpO2: 99% 100% 96% 98%    Final diagnoses:  Dyspnea  Malignant neoplasm of lung, unspecified laterality, unspecified part of lung  Hyponatremia        Mariea Clonts, MD 08/22/14 1409

## 2014-08-22 NOTE — ED Notes (Signed)
Hx of lung ca with mets to brain, liver and colon. Not on chemo. Had brain surgery December 16 for brain mets. Pt was going to have colonoscopy this am for evaluation of colon mets. Pt began to have chest pain with deep breath starting last night at 1800. Denies chest pain at present unless taking deep breath. Feels SOB when taking deep breath.

## 2014-08-22 NOTE — Discharge Instructions (Signed)
Call oncologist tomorrow.  If you were given medicines take as directed.  If you are on coumadin or contraceptives realize their levels and effectiveness is altered by many different medicines.  If you have any reaction (rash, tongues swelling, other) to the medicines stop taking and see a physician.   Please follow up as directed and return to the ER or see a physician for new or worsening symptoms.  Thank you. Filed Vitals:   08/22/14 0959 08/22/14 1230 08/22/14 1249 08/22/14 1319  BP: 135/84  137/67 126/77  Pulse: 92 63 63 70  Temp: 97.8 F (36.6 C)     TempSrc: Oral     Resp: 22 16 15 16   SpO2: 99% 100% 96% 98%

## 2014-08-24 ENCOUNTER — Encounter (HOSPITAL_COMMUNITY): Payer: Self-pay | Admitting: Emergency Medicine

## 2014-08-24 ENCOUNTER — Inpatient Hospital Stay (HOSPITAL_COMMUNITY)
Admission: EM | Admit: 2014-08-24 | Discharge: 2014-08-27 | DRG: 640 | Disposition: A | Discharge status: Hospice | Payer: Medicare HMO | Attending: Internal Medicine | Admitting: Internal Medicine

## 2014-08-24 ENCOUNTER — Emergency Department (HOSPITAL_COMMUNITY): Payer: Medicare HMO

## 2014-08-24 DIAGNOSIS — Z66 Do not resuscitate: Secondary | ICD-10-CM | POA: Diagnosis present

## 2014-08-24 DIAGNOSIS — I708 Atherosclerosis of other arteries: Secondary | ICD-10-CM

## 2014-08-24 DIAGNOSIS — I618 Other nontraumatic intracerebral hemorrhage: Secondary | ICD-10-CM | POA: Diagnosis present

## 2014-08-24 DIAGNOSIS — R519 Headache, unspecified: Secondary | ICD-10-CM | POA: Diagnosis present

## 2014-08-24 DIAGNOSIS — I771 Stricture of artery: Secondary | ICD-10-CM | POA: Diagnosis present

## 2014-08-24 DIAGNOSIS — E860 Dehydration: Secondary | ICD-10-CM

## 2014-08-24 DIAGNOSIS — G936 Cerebral edema: Secondary | ICD-10-CM | POA: Diagnosis present

## 2014-08-24 DIAGNOSIS — Z87891 Personal history of nicotine dependence: Secondary | ICD-10-CM

## 2014-08-24 DIAGNOSIS — C785 Secondary malignant neoplasm of large intestine and rectum: Secondary | ICD-10-CM | POA: Diagnosis present

## 2014-08-24 DIAGNOSIS — E46 Unspecified protein-calorie malnutrition: Secondary | ICD-10-CM | POA: Diagnosis present

## 2014-08-24 DIAGNOSIS — I69354 Hemiplegia and hemiparesis following cerebral infarction affecting left non-dominant side: Secondary | ICD-10-CM | POA: Diagnosis not present

## 2014-08-24 DIAGNOSIS — T380X5A Adverse effect of glucocorticoids and synthetic analogues, initial encounter: Secondary | ICD-10-CM | POA: Diagnosis present

## 2014-08-24 DIAGNOSIS — G893 Neoplasm related pain (acute) (chronic): Secondary | ICD-10-CM | POA: Insufficient documentation

## 2014-08-24 DIAGNOSIS — Z681 Body mass index (BMI) 19 or less, adult: Secondary | ICD-10-CM | POA: Diagnosis not present

## 2014-08-24 DIAGNOSIS — C3490 Malignant neoplasm of unspecified part of unspecified bronchus or lung: Secondary | ICD-10-CM | POA: Diagnosis present

## 2014-08-24 DIAGNOSIS — I712 Thoracic aortic aneurysm, without rupture: Secondary | ICD-10-CM | POA: Diagnosis present

## 2014-08-24 DIAGNOSIS — C7931 Secondary malignant neoplasm of brain: Secondary | ICD-10-CM | POA: Diagnosis present

## 2014-08-24 DIAGNOSIS — R071 Chest pain on breathing: Secondary | ICD-10-CM | POA: Diagnosis present

## 2014-08-24 DIAGNOSIS — Z7952 Long term (current) use of systemic steroids: Secondary | ICD-10-CM

## 2014-08-24 DIAGNOSIS — R64 Cachexia: Secondary | ICD-10-CM | POA: Diagnosis present

## 2014-08-24 DIAGNOSIS — R569 Unspecified convulsions: Secondary | ICD-10-CM | POA: Insufficient documentation

## 2014-08-24 DIAGNOSIS — G9340 Encephalopathy, unspecified: Secondary | ICD-10-CM | POA: Diagnosis present

## 2014-08-24 DIAGNOSIS — D72829 Elevated white blood cell count, unspecified: Secondary | ICD-10-CM | POA: Diagnosis present

## 2014-08-24 DIAGNOSIS — R739 Hyperglycemia, unspecified: Secondary | ICD-10-CM | POA: Diagnosis present

## 2014-08-24 DIAGNOSIS — C787 Secondary malignant neoplasm of liver and intrahepatic bile duct: Secondary | ICD-10-CM | POA: Diagnosis present

## 2014-08-24 DIAGNOSIS — R778 Other specified abnormalities of plasma proteins: Secondary | ICD-10-CM

## 2014-08-24 DIAGNOSIS — I10 Essential (primary) hypertension: Secondary | ICD-10-CM | POA: Diagnosis present

## 2014-08-24 DIAGNOSIS — R079 Chest pain, unspecified: Secondary | ICD-10-CM | POA: Diagnosis present

## 2014-08-24 DIAGNOSIS — R51 Headache: Secondary | ICD-10-CM

## 2014-08-24 DIAGNOSIS — I447 Left bundle-branch block, unspecified: Secondary | ICD-10-CM | POA: Diagnosis present

## 2014-08-24 DIAGNOSIS — R7989 Other specified abnormal findings of blood chemistry: Secondary | ICD-10-CM

## 2014-08-24 DIAGNOSIS — D649 Anemia, unspecified: Secondary | ICD-10-CM | POA: Diagnosis present

## 2014-08-24 DIAGNOSIS — E871 Hypo-osmolality and hyponatremia: Secondary | ICD-10-CM | POA: Diagnosis present

## 2014-08-24 DIAGNOSIS — I159 Secondary hypertension, unspecified: Secondary | ICD-10-CM

## 2014-08-24 DIAGNOSIS — Z515 Encounter for palliative care: Secondary | ICD-10-CM | POA: Insufficient documentation

## 2014-08-24 LAB — COMPREHENSIVE METABOLIC PANEL
ALK PHOS: 237 U/L — AB (ref 39–117)
ALT: 114 U/L — ABNORMAL HIGH (ref 0–53)
AST: 68 U/L — ABNORMAL HIGH (ref 0–37)
Albumin: 2.5 g/dL — ABNORMAL LOW (ref 3.5–5.2)
Anion gap: 11 (ref 5–15)
BILIRUBIN TOTAL: 0.9 mg/dL (ref 0.3–1.2)
BUN: 29 mg/dL — ABNORMAL HIGH (ref 6–23)
CHLORIDE: 91 meq/L — AB (ref 96–112)
CO2: 25 mmol/L (ref 19–32)
Calcium: 8.7 mg/dL (ref 8.4–10.5)
Creatinine, Ser: 0.81 mg/dL (ref 0.50–1.35)
GFR calc Af Amer: >90 mL/min (ref >90)
GFR calc non Af Amer: >90 mL/min (ref >90)
Glucose, Bld: 572 mg/dL (ref 70–99)
POTASSIUM: 4.7 mmol/L (ref 3.5–5.1)
SODIUM: 127 mmol/L — AB (ref 135–145)
TOTAL PROTEIN: 5.5 g/dL — AB (ref 6.0–8.3)

## 2014-08-24 LAB — CBC WITH DIFFERENTIAL/PLATELET
Basophils Absolute: 0 10*3/uL (ref 0.0–0.1)
Basophils Relative: 0 % (ref 0–1)
EOS ABS: 0.1 10*3/uL (ref 0.0–0.7)
EOS PCT: 0 % (ref 0–5)
HEMATOCRIT: 26 % — AB (ref 39.0–52.0)
HEMOGLOBIN: 8.9 g/dL — AB (ref 13.0–17.0)
LYMPHS PCT: 7 % — AB (ref 12–46)
Lymphs Abs: 1.5 10*3/uL (ref 0.7–4.0)
MCH: 30.6 pg (ref 26.0–34.0)
MCHC: 34.2 g/dL (ref 30.0–36.0)
MCV: 89.3 fL (ref 78.0–100.0)
MONOS PCT: 8 % (ref 3–12)
Monocytes Absolute: 1.6 10*3/uL — ABNORMAL HIGH (ref 0.1–1.0)
NEUTROS ABS: 17.3 10*3/uL — AB (ref 1.7–7.7)
NEUTROS PCT: 85 % — AB (ref 43–77)
Platelets: 130 10*3/uL — ABNORMAL LOW (ref 150–400)
RBC: 2.91 MIL/uL — AB (ref 4.22–5.81)
RDW: 14.8 % (ref 11.5–15.5)
WBC: 20.5 10*3/uL — ABNORMAL HIGH (ref 4.0–10.5)

## 2014-08-24 LAB — URINALYSIS, ROUTINE W REFLEX MICROSCOPIC
BILIRUBIN URINE: NEGATIVE
Glucose, UA: >1000 mg/dL — AB
Hgb urine dipstick: NEGATIVE
KETONES UR: NEGATIVE mg/dL
LEUKOCYTES UA: NEGATIVE
Nitrite: NEGATIVE
PH: 6.5 (ref 5.0–8.0)
PROTEIN: NEGATIVE mg/dL
SPECIFIC GRAVITY, URINE: 1.036 — AB (ref 1.005–1.030)
UROBILINOGEN UA: 1 mg/dL (ref 0.0–1.0)

## 2014-08-24 LAB — BASIC METABOLIC PANEL
ANION GAP: 8 (ref 5–15)
ANION GAP: 9 (ref 5–15)
BUN: 24 mg/dL — ABNORMAL HIGH (ref 6–23)
BUN: 25 mg/dL — ABNORMAL HIGH (ref 6–23)
CHLORIDE: 98 meq/L (ref 96–112)
CO2: 25 mmol/L (ref 19–32)
CO2: 25 mmol/L (ref 19–32)
Calcium: 7.9 mg/dL — ABNORMAL LOW (ref 8.4–10.5)
Calcium: 8.1 mg/dL — ABNORMAL LOW (ref 8.4–10.5)
Chloride: 99 mEq/L (ref 96–112)
Creatinine, Ser: 0.77 mg/dL (ref 0.50–1.35)
Creatinine, Ser: 0.72 mg/dL (ref 0.50–1.35)
GFR calc Af Amer: >90 mL/min (ref >90)
GFR calc non Af Amer: >90 mL/min (ref >90)
GFR calc non Af Amer: >90 mL/min (ref >90)
GLUCOSE: 242 mg/dL — AB (ref 70–99)
Glucose, Bld: 240 mg/dL — ABNORMAL HIGH (ref 70–99)
POTASSIUM: 3.9 mmol/L (ref 3.5–5.1)
POTASSIUM: 4.1 mmol/L (ref 3.5–5.1)
SODIUM: 132 mmol/L — AB (ref 135–145)
SODIUM: 132 mmol/L — AB (ref 135–145)

## 2014-08-24 LAB — GLUCOSE, CAPILLARY
Glucose-Capillary: 176 mg/dL — ABNORMAL HIGH (ref 70–99)
Glucose-Capillary: 195 mg/dL — ABNORMAL HIGH (ref 70–99)
Glucose-Capillary: 212 mg/dL — ABNORMAL HIGH (ref 70–99)
Glucose-Capillary: 235 mg/dL — ABNORMAL HIGH (ref 70–99)
Glucose-Capillary: 316 mg/dL — ABNORMAL HIGH (ref 70–99)

## 2014-08-24 LAB — TROPONIN I: Troponin I: 0.06 ng/mL — ABNORMAL HIGH (ref <0.031)

## 2014-08-24 LAB — CBG MONITORING, ED
GLUCOSE-CAPILLARY: 580 mg/dL — AB (ref 70–99)
GLUCOSE-CAPILLARY: 398 mg/dL — AB (ref 70–99)

## 2014-08-24 LAB — MAGNESIUM: Magnesium: 1.9 mg/dL (ref 1.5–2.5)

## 2014-08-24 LAB — URINE MICROSCOPIC-ADD ON

## 2014-08-24 MED ORDER — ACETAMINOPHEN 650 MG RE SUPP
650.0000 mg | Freq: Four times a day (QID) | RECTAL | Status: DC | PRN
Start: 2014-08-24 — End: 2014-08-25

## 2014-08-24 MED ORDER — INSULIN REGULAR BOLUS VIA INFUSION: 0.0000 [IU] | Freq: Three times a day (TID) | INTRAVENOUS | Status: DC

## 2014-08-24 MED ORDER — OMEGA-3-ACID ETHYL ESTERS 1 G PO CAPS
1.0000 g | ORAL_CAPSULE | Freq: Every day | ORAL | Status: DC
  Filled 2014-08-24: qty 1

## 2014-08-24 MED ORDER — SODIUM CHLORIDE 0.9 % IV SOLN
INTRAVENOUS | Status: DC
  Administered 2014-08-24: 19:00:00 via INTRAVENOUS
  Filled 2014-08-24: qty 2.5

## 2014-08-24 MED ORDER — SODIUM CHLORIDE 0.9 % IV SOLN
INTRAVENOUS | Status: DC
  Administered 2014-08-24: 2.6 [IU]/h via INTRAVENOUS
  Administered 2014-08-24 (×2): 3.4 [IU]/h via INTRAVENOUS
  Filled 2014-08-24: qty 2.5

## 2014-08-24 MED ORDER — CHLORHEXIDINE GLUCONATE 0.12 % MT SOLN
15.0000 mL | Freq: Two times a day (BID) | OROMUCOSAL | Status: DC
  Administered 2014-08-24 – 2014-08-27 (×6): 15 mL via OROMUCOSAL
  Filled 2014-08-24 (×8): qty 15

## 2014-08-24 MED ORDER — DEXTROSE 50 % IV SOLN: 25.0000 mL | INTRAVENOUS | Status: DC | PRN

## 2014-08-24 MED ORDER — SODIUM CHLORIDE 0.9 % IV SOLN
1000.0000 mL | INTRAVENOUS | Status: DC
  Administered 2014-08-24: 1000 mL via INTRAVENOUS

## 2014-08-24 MED ORDER — MORPHINE SULFATE 2 MG/ML IJ SOLN
2.0000 mg | INTRAMUSCULAR | Status: DC | PRN
  Administered 2014-08-24: 2 mg via INTRAVENOUS
  Filled 2014-08-24 (×2): qty 1

## 2014-08-24 MED ORDER — ACETAMINOPHEN 325 MG PO TABS: 650.0000 mg | ORAL_TABLET | Freq: Four times a day (QID) | ORAL | Status: DC | PRN

## 2014-08-24 MED ORDER — LISINOPRIL 10 MG PO TABS
10.0000 mg | ORAL_TABLET | Freq: Every day | ORAL | Status: DC
  Filled 2014-08-24: qty 1

## 2014-08-24 MED ORDER — CETYLPYRIDINIUM CHLORIDE 0.05 % MT LIQD
7.0000 mL | Freq: Two times a day (BID) | OROMUCOSAL | Status: DC
  Administered 2014-08-25 – 2014-08-27 (×5): 7 mL via OROMUCOSAL

## 2014-08-24 MED ORDER — ENOXAPARIN SODIUM 40 MG/0.4ML ~~LOC~~ SOLN
40.0000 mg | SUBCUTANEOUS | Status: DC
  Filled 2014-08-24 (×2): qty 0.4

## 2014-08-24 MED ORDER — DEXTROSE-NACL 5-0.45 % IV SOLN: INTRAVENOUS | Status: DC

## 2014-08-24 MED ORDER — SODIUM CHLORIDE 0.9 % IV SOLN
INTRAVENOUS | Status: DC
  Administered 2014-08-25: 02:00:00 via INTRAVENOUS

## 2014-08-24 MED ORDER — DEXTROSE-NACL 5-0.45 % IV SOLN
INTRAVENOUS | Status: DC
  Administered 2014-08-24: 20:00:00 via INTRAVENOUS

## 2014-08-24 MED ORDER — INSULIN REGULAR BOLUS VIA INFUSION
0.0000 [IU] | Freq: Three times a day (TID) | INTRAVENOUS | Status: DC
  Filled 2014-08-24: qty 10

## 2014-08-24 MED ORDER — SODIUM CHLORIDE 0.9 % IV BOLUS (SEPSIS)
1000.0000 mL | Freq: Once | INTRAVENOUS | Status: AC
  Administered 2014-08-24: 1000 mL via INTRAVENOUS

## 2014-08-24 NOTE — Progress Notes (Signed)
69 year old Caucasian male admitted to room [phone] via stretcher from ED with chief complaint of weakness, dehydration, and hyperglycemia.  Glucommander on.  Family in attendance.  Recently diagnosed with ST.IV metastatic cancer of cecum, brain cancer, liver cancer, and lung cancer.  Overtly, this gentleman appears older than his stated age and has obvious discomfort when taking deep breaths.  Refusing pain medication at this time, though benefits described to him.  Primary focus of nursing plan is comfort and pain management.  O2 on at 2 lpm for comfort.  Voiding in large amts.  Please refer to [facility] for medication, fluid intake, and insulin dosages.

## 2014-08-24 NOTE — ED Notes (Signed)
CBG is 398. Notified nurse [place].

## 2014-08-24 NOTE — H&P (Signed)
[facility] History and Physical  Anthony Lopez RDE:[id number] DOB: 01/01/1946 DOA: 08/24/2014  Referring physician: Dr. Quintella Lopez PCP: Anthony Seller, MD  Specialists: Dr. Julien Lopez, oncology  Chief Complaint: headache  HPI: Anthony Lopez is a 69 y.o. male  With a history of stage IV metastatic carcinoma, hypertension, but presented to the emergency department with complaints of generalized weakness. Patient was found to have hyperglycemia and be very dehydrated. Patient supposedly complained of chest pain however upon my examination, patient no longer complained of chest pain. He denied any shortness of breath, chest pain, abdominal pain. Patient did complain of headache. Patient also complains of excessive thirst and dry mouth. Patient was diagnosed with cancer in November 2015, follows up with Dr. Earlie Lopez. Patient was scheduled for a colonoscopy for evaluation of a cecal mass however was found to be too weak. Patient has not started chemotherapy. He has received 1 dose of radiation. In the ER, patient was noted to have blood glucose of 580, [facility] was asked to admit.  Review of Systems:  Constitutional: Complains of weakness and fatigue. HEENT: Complains of dry mouth Respiratory: Denies SOB, DOE, cough, chest tightness,  and wheezing.   Cardiovascular: Denies chest pain, palpitations and leg swelling.  Gastrointestinal: Denies nausea, vomiting, abdominal pain, diarrhea, constipation, blood in stool and abdominal distention.  Genitourinary: Denies dysuria, urgency, frequency, hematuria, flank pain and difficulty urinating.  Musculoskeletal: Denies myalgias, back pain, joint swelling, arthralgias and gait problem.  Skin: Denies pallor, rash and wound.  Neurological: Complains of headache Hematological: Denies adenopathy. Easy bruising, personal or family bleeding history  Psychiatric/Behavioral: Denies suicidal ideation, mood changes, confusion, nervousness, sleep disturbance  and agitation  Past Medical History  Diagnosis Date  . CVA (cerebral infarction) 2000  . HTN (hypertension), benign   . Rheumatic fever     Age 12 or 43  . Tobacco abuse   . Lung cancer   . Brain cancer   . Anxiety   . Headache   . Dizziness   . LBBB (left bundle branch block)   . Bilateral carotid bruits   . Subclavian artery stenosis   . Metastasis to colon   . Metastasis to liver    Past Surgical History  Procedure Laterality Date  . Transthoracic echocardiogram  January 2004    Normal LV size and function. EF 55-65%.  . Carotid dopplers Bilateral 07/18/2013    Right subclavian 0-49%, left subclavian mild bilateral carotid stenosis..  . 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   . Mohs surgery      h/o NMSC (scc or bcc)  . Nose surgery      cancer removed  . Hernia repair    . Craniotomy Right 07/23/2014    Procedure: RIGHT CRANIOTOMY FOR TUMOR WITH CURVE;  Surgeon: Anthony Moore, MD;  Location: [facility];  Service: Neurosurgery;  Laterality: Right;  RIGHT CRANIOTOMY FOR TUMOR WITH CURVE   Social History:  reports that he quit smoking about 4 weeks ago. His smoking use included Cigarettes. He has a 50 pack-year smoking history. He has never used smokeless tobacco. He reports that he does not drink alcohol or use illicit drugs.   Allergies  Allergen Reactions  . Doxycycline     Stay asleep   . Penicillins Rash    Childhood reaction    Family History  Problem Relation Age of Onset  .  Diabetes Mother   . Diabetes Father   . Heart attack Father   . Skin cancer Mother   . Hypertension Daughter     Prior to Admission medications   Medication Sig Start Date End Date Taking? Authorizing Provider  acetaminophen (TYLENOL) 500 MG tablet Take 1,000 mg by mouth every 6 (six) hours as needed for mild pain or moderate pain.   Yes Historical Provider, MD    dexamethasone (DECADRON) 4 MG tablet Take 1 tablet (4 mg total) by mouth 2 (two) times daily. 08/18/14  Yes Anthony Paula, MD  lisinopril (PRINIVIL,ZESTRIL) 10 MG tablet Take 10 mg by mouth daily.   Yes Historical Provider, MD  Omega-3 Fatty Acids (FISH OIL) 1000 MG CAPS Take 1 capsule by mouth daily.    Yes Historical Provider, MD  MOVIPREP 100 G SOLR Take 1 kit (200 g total) by mouth once. Patient not taking: Reported on 08/18/2014 08/12/14   Vita Barley Hvozdovic, PA-C   Physical Exam: Filed Vitals:   08/24/14 1700  BP: 126/61  Pulse: 72  Temp:   Resp: 20     General: Well developed, malnourished, NAD, appears stated age  HEENT: NCAT, PERRLA, EOMI, Anicteic Sclera, mucous membranes dry   Neck: Supple, no JVD, no masses  Cardiovascular: S1 S2 auscultated, no rubs, murmurs or gallops. Regular rate and rhythm.  Respiratory: Clear to auscultation bilaterally with equal chest rise  Abdomen: Soft, nontender, nondistended, + bowel sounds  Extremities: warm dry without cyanosis clubbing or edema  Neuro: AAOx3, cranial nerves grossly intact. Able to move all extremities  Skin: Without rashes exudates or nodules  Psych: Appropriate  Labs on Admission:  Basic Metabolic Panel:  Recent Labs Lab 08/22/14 1035 08/24/14 1445  NA 126* 127*  K 4.4 4.7  CL 89* 91*  CO2 25 25  GLUCOSE 364* 572*  BUN 27* 29*  CREATININE 0.80 0.81  CALCIUM 8.9 8.7  MG  --  1.9   Liver Function Tests:  Recent Labs Lab 08/24/14 1445  AST 68*  ALT 114*  ALKPHOS 237*  BILITOT 0.9  PROT 5.5*  ALBUMIN 2.5*   No results for input(s): LIPASE, AMYLASE in the last 168 hours. No results for input(s): AMMONIA in the last 168 hours. CBC:  Recent Labs Lab 08/22/14 1035 08/24/14 1445  WBC 16.9* 20.5*  NEUTROABS 14.6* 17.3*  HGB 11.4* 8.9*  HCT 33.2* 26.0*  MCV 91.5 89.3  PLT 195 130*   Cardiac Enzymes:  Recent Labs Lab 08/22/14 1035 08/24/14 1445  TROPONINI <0.03 0.06*    BNP (last  3 results) No results for input(s): PROBNP in the last 8760 hours. CBG:  Recent Labs Lab 08/24/14 1322  GLUCAP 580*    Radiological Exams on Admission: Dg Chest Port 1 View  08/24/2014   CLINICAL DATA:  Diffuse chest pain with deep inspiration, worse for 2 days. Lung cancer.  EXAM: PORTABLE CHEST - 1 VIEW  COMPARISON:  Chest CT and x-ray 08/22/2014  FINDINGS: Cavitary mass lesion again noted in the left mid lung. Associated soft tissue within the cavitary lesion has increased, possibly fluid within the cavity. Left hilar fullness compatible with mass/ adenopathy as seen on prior CT. Scarring in the apices. No acute opacities on the right. No effusions. Heart is normal size.  IMPRESSION: Cavitary and left hilar mass again noted in the left lung. Possible with increasing fluid within the cavitary mass since prior study.   Electronically Signed   By: Rolm Baptise M.D.  On: 08/24/2014 15:16    EKG: Independently reviewed. SR, rate 99, LBBB (no new changes)  Assessment/Plan  Hyperglycemia -Will admit patient to medical floor -No anion gap, likely complicated and secondary to decadron -Will place patient on glucostabilizer and IVF -Will monitor BMP q4hrs and CBGs hourly -Will transition to SQ insulin once sugars are below 250  Stage 4 NSCLC -patient has metastatic disease: brain/lung mets and cecal mass -Cannot undergo colonoscopy as patient was deemed to be too weak -Oncology made aware patient's admission -Will consult palliative care -Will hold decadron  Elevated troponin -Patient denies chest -Will continue to cycle troponins -Patient unlikely candidate for invasive procedures  Elevated liver enzymes -Possibly secondary to cancer versus dehydration -Will continue to monitor  Headaches -Patient s/p craniotomy -Will hold decadron -Pain control as needed  Leukocytosis -Likely secondary to Decadron versus acute phase reactant -UA negative, CT chest-no infection -Continue  to monitor CBC  Normocytic anemia -Baseline hemoglobin appears to be 11, currently 8.9  Hypertension -Continue Lisinopril  DVT prophylaxis:  Lovenox  Code Status: DNR  Condition: Guarded  Family Communication: Wife at bedside. Admission, patients condition and plan of care including tests being ordered have been discussed with the patient and wife who indicate understanding and agree with the plan and Code Status.  Disposition Plan:  Admitted  Time spent: 65 minutes  Davidmichael Zarazua D.O. [facility] Pager [phone]  If 7PM-7AM, please contact night-coverage [email or web address] Password [facility] 08/24/2014, 5:26 PM

## 2014-08-24 NOTE — ED Notes (Signed)
CBG is 580. Notified nurse Pamala Hurry.

## 2014-08-24 NOTE — ED Notes (Signed)
I gave the patient a cup of ice water. 

## 2014-08-24 NOTE — ED Provider Notes (Signed)
CSN: [id number]     Arrival date & time 08/24/14  1300 History   First MD Initiated Contact with Patient 08/24/14 1356     Chief Complaint  Patient presents with  . Chest Pain     Patient is a 69 y.o. male presenting with chest pain. The history is provided by the patient and the spouse.  Chest Pain  Anthony Lopez presents for evaluation of chest pain and shortness of breath.  He has a hx/o non-small cell lung cancer, stage IV and has pain with breathing a few days ago.  He was seen in the ED and had a CT scan of his chest that showed progression of his cancer.  He also complains of generalized weakness, excessive thirst, and high blood sugars.  No fevers, cough, no leg swelling, abdominal pain, constipation, diarrhea, difficulty urinating.  Symptoms are moderate, constant, worsening.  Past Medical History  Diagnosis Date  . CVA (cerebral infarction) 2000  . HTN (hypertension), benign   . Rheumatic fever     Age 8 or 44  . Tobacco abuse   . Lung cancer   . Brain cancer   . Anxiety   . Headache   . Dizziness   . LBBB (left bundle branch block)   . Bilateral carotid bruits   . Subclavian artery stenosis   . Metastasis to colon   . Metastasis to liver    Past Surgical History  Procedure Laterality Date  . Transthoracic echocardiogram  January 2004    Normal LV size and function. EF 55-65%.  . Carotid dopplers Bilateral 07/18/2013    Right subclavian 0-49%, left subclavian mild bilateral carotid stenosis..  . 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   . Mohs surgery      h/o NMSC (scc or bcc)  . Nose surgery      cancer removed  . Hernia repair    . Craniotomy Right 07/23/2014    Procedure: RIGHT CRANIOTOMY FOR TUMOR WITH CURVE;  Surgeon: Eustace Moore, MD;  Location: [facility];  Service: Neurosurgery;  Laterality: Right;  RIGHT CRANIOTOMY FOR TUMOR WITH CURVE   Family  History  Problem Relation Age of Onset  . Diabetes Mother   . Diabetes Father   . Heart attack Father   . Skin cancer Mother   . Hypertension Daughter    History  Substance Use Topics  . Smoking status: Former Smoker -- 1.00 packs/day for 50 years    Types: Cigarettes    Quit date: 07/23/2014  . Smokeless tobacco: Never Used     Comment: Smokes < a pack per day now.  . Alcohol Use: No    Review of Systems  Cardiovascular: Positive for chest pain.      Allergies  Doxycycline and Penicillins  Home Medications   Prior to Admission medications   Medication Sig Start Date End Date Taking? Authorizing Provider  acetaminophen (TYLENOL) 500 MG tablet Take 1,000 mg by mouth every 6 (six) hours as needed for mild pain or moderate pain.   Yes Historical Provider, MD  dexamethasone (DECADRON) 4 MG tablet Take 1 tablet (4 mg total) by mouth 2 (two) times daily. 08/18/14  Yes Lora Paula, MD  lisinopril (PRINIVIL,ZESTRIL) 10 MG tablet Take 10 mg by mouth daily.   Yes Historical Provider, MD  Omega-3 Fatty Acids (FISH OIL) 1000 MG CAPS Take 1 capsule by  mouth daily.    Yes Historical Provider, MD  MOVIPREP 100 G SOLR Take 1 kit (200 g total) by mouth once. Patient not taking: Reported on 08/18/2014 08/12/14   Lori P Hvozdovic, PA-C   BP 100/59 mmHg  Pulse 91  Temp(Src) 98 F (36.7 C) (Oral)  Resp 22  Ht 5' 10"  (1.778 m)  Wt 124 lb (56.246 kg)  BMI 17.79 kg/m2  SpO2 100% Physical Exam  Constitutional: He is oriented to person, place, and time.  cachectic  HENT:  Head: Atraumatic.  Dry mucous membranes  Cardiovascular: Normal rate and regular rhythm.   Pulmonary/Chest:  tachypneic with occasional rhonchi bilaterally  Abdominal: Soft. There is no tenderness. There is no rebound and no guarding.  Musculoskeletal: He exhibits no edema or tenderness.  Neurological: He is alert and oriented to person, place, and time.  Skin: Skin is warm and dry.  Psychiatric: He has a normal  mood and affect. His behavior is normal.  Nursing note and vitals reviewed.   ED Course  Procedures (including critical care time) Labs Review Labs Reviewed  COMPREHENSIVE METABOLIC PANEL - Abnormal; Notable for the following:    Sodium 127 (*)    Chloride 91 (*)    Glucose, Bld 572 (*)    BUN 29 (*)    Total Protein 5.5 (*)    Albumin 2.5 (*)    AST 68 (*)    ALT 114 (*)    Alkaline Phosphatase 237 (*)    All other components within normal limits  CBC WITH DIFFERENTIAL - Abnormal; Notable for the following:    WBC 20.5 (*)    RBC 2.91 (*)    Hemoglobin 8.9 (*)    HCT 26.0 (*)    Platelets 130 (*)    Neutrophils Relative % 85 (*)    Neutro Abs 17.3 (*)    Lymphocytes Relative 7 (*)    Monocytes Absolute 1.6 (*)    All other components within normal limits  URINALYSIS, ROUTINE W REFLEX MICROSCOPIC - Abnormal; Notable for the following:    Specific Gravity, Urine 1.036 (*)    Glucose, UA >1000 (*)    All other components within normal limits  TROPONIN I - Abnormal; Notable for the following:    Troponin I 0.06 (*)    All other components within normal limits  CBG MONITORING, ED - Abnormal; Notable for the following:    Glucose-Capillary 580 (*)    All other components within normal limits  URINE CULTURE  MAGNESIUM  URINE MICROSCOPIC-ADD ON    Imaging Review Dg Chest Port 1 View  08/24/2014   CLINICAL DATA:  Diffuse chest pain with deep inspiration, worse for 2 days. Lung cancer.  EXAM: PORTABLE CHEST - 1 VIEW  COMPARISON:  Chest CT and x-ray 08/22/2014  FINDINGS: Cavitary mass lesion again noted in the left mid lung. Associated soft tissue within the cavitary lesion has increased, possibly fluid within the cavity. Left hilar fullness compatible with mass/ adenopathy as seen on prior CT. Scarring in the apices. No acute opacities on the right. No effusions. Heart is normal size.  IMPRESSION: Cavitary and left hilar mass again noted in the left lung. Possible with  increasing fluid within the cavitary mass since prior study.   Electronically Signed   By: Rolm Baptise M.D.   On: 08/24/2014 15:16     EKG Interpretation   Date/Time:  Sunday August 24 2014 13:15:04 EST Ventricular Rate:  99 PR Interval:  147 QRS  Duration: 127 QT Interval:  362 QTC Calculation: 464 R Axis:   59 Text Interpretation:  Sinus rhythm Biatrial enlargement IVCD, consider  atypical LBBB Baseline wander in lead(s) V1 Confirmed by Hazle Coca [id number])  on 08/24/2014 2:49:43 PM      MDM   Final diagnoses:  Chest pain on breathing  Hyperglycemia  Dehydration    Patient with history of metastatic non-small cell lung cancer here for evaluation of chest pain and progressive weakness. Repeat prior visit from January 15 for patient had CT PE study performed that demonstrated progression of his lung masses. Patient is noted to be frail and cachectic in the emergency department and appears to be dehydrated based on lab evaluation and physical exam. Providing IV fluids for hydration. Patient is noted to be hyperglycemic which is likely secondary to his Decadron use. Starting on glucose stabilizer given his hyperglycemia. Discussed with oncologist on-call, Dr. Lindi Adie regarding patient and need for oncology consult in the morning.  Discussed with hospitalist regarding admission for hyperglycemic control and symptom control pending further oncology evaluation regarding treatment options.    Quintella Reichert, MD 08/24/14 [phone]

## 2014-08-24 NOTE — ED Notes (Signed)
Hx lung ca, prob mets.  Was to have colonoscopy but too weak.  Wife reports pt cxr has changed but unclear if it is the cause of pain with deep inspiration. EMS gave 324 mg aspirin and ntg sl x 1 with relief.

## 2014-08-25 ENCOUNTER — Inpatient Hospital Stay (HOSPITAL_COMMUNITY): Payer: Medicare HMO

## 2014-08-25 DIAGNOSIS — G893 Neoplasm related pain (acute) (chronic): Secondary | ICD-10-CM | POA: Insufficient documentation

## 2014-08-25 DIAGNOSIS — C3431 Malignant neoplasm of lower lobe, right bronchus or lung: Secondary | ICD-10-CM

## 2014-08-25 DIAGNOSIS — Z515 Encounter for palliative care: Secondary | ICD-10-CM | POA: Insufficient documentation

## 2014-08-25 DIAGNOSIS — R569 Unspecified convulsions: Secondary | ICD-10-CM | POA: Insufficient documentation

## 2014-08-25 LAB — GLUCOSE, CAPILLARY
GLUCOSE-CAPILLARY: 242 mg/dL — AB (ref 70–99)
GLUCOSE-CAPILLARY: 262 mg/dL — AB (ref 70–99)
GLUCOSE-CAPILLARY: 160 mg/dL — AB (ref 70–99)
GLUCOSE-CAPILLARY: 129 mg/dL — AB (ref 70–99)
Glucose-Capillary: 134 mg/dL — ABNORMAL HIGH (ref 70–99)
Glucose-Capillary: 140 mg/dL — ABNORMAL HIGH (ref 70–99)
Glucose-Capillary: 140 mg/dL — ABNORMAL HIGH (ref 70–99)
Glucose-Capillary: 152 mg/dL — ABNORMAL HIGH (ref 70–99)
Glucose-Capillary: 168 mg/dL — ABNORMAL HIGH (ref 70–99)
Glucose-Capillary: 170 mg/dL — ABNORMAL HIGH (ref 70–99)
Glucose-Capillary: 212 mg/dL — ABNORMAL HIGH (ref 70–99)

## 2014-08-25 LAB — CBC
HEMATOCRIT: 24.9 % — AB (ref 39.0–52.0)
Hemoglobin: 8.7 g/dL — ABNORMAL LOW (ref 13.0–17.0)
MCH: 32.1 pg (ref 26.0–34.0)
MCHC: 34.9 g/dL (ref 30.0–36.0)
MCV: 91.9 fL (ref 78.0–100.0)
Platelets: 121 10*3/uL — ABNORMAL LOW (ref 150–400)
RBC: 2.71 MIL/uL — AB (ref 4.22–5.81)
RDW: 15.1 % (ref 11.5–15.5)
WBC: 18.3 10*3/uL — ABNORMAL HIGH (ref 4.0–10.5)

## 2014-08-25 LAB — BASIC METABOLIC PANEL
ANION GAP: 7 (ref 5–15)
Anion gap: 4 — ABNORMAL LOW (ref 5–15)
Anion gap: 4 — ABNORMAL LOW (ref 5–15)
BUN: 23 mg/dL (ref 6–23)
BUN: 21 mg/dL (ref 6–23)
BUN: 22 mg/dL (ref 6–23)
CALCIUM: 8.1 mg/dL — AB (ref 8.4–10.5)
CALCIUM: 8.1 mg/dL — AB (ref 8.4–10.5)
CO2: 26 mmol/L (ref 19–32)
CO2: 25 mmol/L (ref 19–32)
CO2: 24 mmol/L (ref 19–32)
CREATININE: 0.58 mg/dL (ref 0.50–1.35)
Calcium: 7.8 mg/dL — ABNORMAL LOW (ref 8.4–10.5)
Chloride: 98 mEq/L (ref 96–112)
Chloride: 100 mEq/L (ref 96–112)
Chloride: 100 mEq/L (ref 96–112)
Creatinine, Ser: 0.68 mg/dL (ref 0.50–1.35)
Creatinine, Ser: 0.73 mg/dL (ref 0.50–1.35)
GFR calc Af Amer: >90 mL/min (ref >90)
GFR calc non Af Amer: >90 mL/min (ref >90)
GFR calc non Af Amer: >90 mL/min (ref >90)
GLUCOSE: 248 mg/dL — AB (ref 70–99)
Glucose, Bld: 139 mg/dL — ABNORMAL HIGH (ref 70–99)
Glucose, Bld: 172 mg/dL — ABNORMAL HIGH (ref 70–99)
POTASSIUM: 3.7 mmol/L (ref 3.5–5.1)
Potassium: 3.9 mmol/L (ref 3.5–5.1)
Potassium: 4 mmol/L (ref 3.5–5.1)
SODIUM: 128 mmol/L — AB (ref 135–145)
Sodium: 131 mmol/L — ABNORMAL LOW (ref 135–145)
Sodium: 129 mmol/L — ABNORMAL LOW (ref 135–145)

## 2014-08-25 LAB — URINE CULTURE

## 2014-08-25 LAB — HEMOGLOBIN A1C
Hgb A1c MFr Bld: 9.3 % — ABNORMAL HIGH (ref <5.7)
MEAN PLASMA GLUCOSE: 220 mg/dL — AB (ref <117)

## 2014-08-25 LAB — MAGNESIUM: MAGNESIUM: 1.7 mg/dL (ref 1.5–2.5)

## 2014-08-25 LAB — TROPONIN I
TROPONIN I: 0.08 ng/mL — AB (ref <0.031)
Troponin I: <0.03 ng/mL (ref <0.031)

## 2014-08-25 MED ORDER — LEVETIRACETAM IN NACL 500 MG/100ML IV SOLN
500.0000 mg | Freq: Two times a day (BID) | INTRAVENOUS | Status: DC
  Administered 2014-08-25 – 2014-08-27 (×4): 500 mg via INTRAVENOUS
  Filled 2014-08-25 (×5): qty 100

## 2014-08-25 MED ORDER — INSULIN ASPART 100 UNIT/ML ~~LOC~~ SOLN: 0.0000 [IU] | Freq: Three times a day (TID) | SUBCUTANEOUS | Status: DC

## 2014-08-25 MED ORDER — LORAZEPAM 2 MG/ML IJ SOLN: 1.0000 mg | INTRAMUSCULAR | Status: DC | PRN

## 2014-08-25 MED ORDER — MORPHINE SULFATE 2 MG/ML IJ SOLN
2.0000 mg | Freq: Once | INTRAMUSCULAR | Status: AC
  Administered 2014-08-25: 2 mg via INTRAVENOUS

## 2014-08-25 MED ORDER — LORAZEPAM 2 MG/ML IJ SOLN
INTRAMUSCULAR | Status: AC
  Filled 2014-08-25: qty 1

## 2014-08-25 MED ORDER — DEXAMETHASONE SODIUM PHOSPHATE 4 MG/ML IJ SOLN
12.0000 mg | Freq: Every day | INTRAMUSCULAR | Status: DC
  Administered 2014-08-26 – 2014-08-27 (×2): 12 mg via INTRAVENOUS
  Filled 2014-08-25 (×2): qty 3

## 2014-08-25 MED ORDER — LEVETIRACETAM 500 MG PO TABS
500.0000 mg | ORAL_TABLET | Freq: Two times a day (BID) | ORAL | Status: DC
  Filled 2014-08-25 (×2): qty 1

## 2014-08-25 MED ORDER — DEXTROSE-NACL 5-0.45 % IV SOLN
INTRAVENOUS | Status: DC
  Administered 2014-08-25: 10:00:00 via INTRAVENOUS

## 2014-08-25 MED ORDER — LEVETIRACETAM IN NACL 1000 MG/100ML IV SOLN
1000.0000 mg | Freq: Once | INTRAVENOUS | Status: AC
  Administered 2014-08-25: 1000 mg via INTRAVENOUS
  Filled 2014-08-25: qty 100

## 2014-08-25 MED ORDER — MORPHINE SULFATE 2 MG/ML IJ SOLN
2.0000 mg | INTRAMUSCULAR | Status: DC | PRN
  Administered 2014-08-25 – 2014-08-27 (×5): 2 mg via INTRAVENOUS
  Filled 2014-08-25 (×5): qty 1

## 2014-08-25 MED ORDER — DEXAMETHASONE SODIUM PHOSPHATE 4 MG/ML IJ SOLN
4.0000 mg | Freq: Four times a day (QID) | INTRAMUSCULAR | Status: DC
  Administered 2014-08-25: 4 mg via INTRAVENOUS
  Filled 2014-08-25 (×7): qty 1

## 2014-08-25 MED ORDER — DEXAMETHASONE SODIUM PHOSPHATE 10 MG/ML IJ SOLN
10.0000 mg | Freq: Once | INTRAMUSCULAR | Status: AC
  Administered 2014-08-25: 10 mg via INTRAVENOUS
  Filled 2014-08-25: qty 1

## 2014-08-25 MED ORDER — SODIUM CHLORIDE 0.9 % IV SOLN
INTRAVENOUS | Status: DC
  Administered 2014-08-25: 1.8 [IU]/h via INTRAVENOUS
  Filled 2014-08-25: qty 2.5

## 2014-08-25 MED ORDER — SODIUM CHLORIDE 0.9 % IV SOLN
INTRAVENOUS | Status: DC
  Administered 2014-08-25: 05:00:00 via INTRAVENOUS

## 2014-08-25 NOTE — Significant Event (Addendum)
Rapid Response Event Note Called to see pt for new onset seizures Overview: Time Called: 0330 Arrival Time: 0332 Event Type: Neurologic  Initial Focused Assessment: On arrival to floor pt's seizure had resolved and he was post-ictal.  Per family & RN pt started with LUE shaking and generalized to full body seizure.    Interventions: Keppra 1 gm IV per MD order STAT CT Head  Event Summary: Name of Physician Notified: Dr. Leonel Ramsay at [phone]    at    Outcome: Morven in room and stabalized     Anthony Lopez

## 2014-08-25 NOTE — Progress Notes (Signed)
Nutrition Brief Note  Chart reviewed. Pt now transitioning to comfort care.  No further nutrition interventions warranted at this time.  Please re-consult as needed.   Neo Yepiz A. Tecia Cinnamon, RD, LDN, CDE Pager: [phone] After hours Pager: [phone]  

## 2014-08-25 NOTE — Progress Notes (Signed)
Radiation Oncology         [phone]) [phone] ________________________________  Name: Anthony Lopez MRN: [id number]  Date: 08/24/2014  DOB: 07-Apr-1946  Chart Note:  I reviewed this patient's most recent findings and wanted to take a minute to document my impression.  Radiographic Findings: Ct Head Wo Contrast  08/25/2014   CLINICAL DATA:  New onset seizure. History of lung cancer with brain metastasis.  EXAM: CT HEAD WITHOUT CONTRAST  TECHNIQUE: Contiguous axial images were obtained from the base of the skull through the vertex without intravenous contrast.  COMPARISON:  MRI of the head July 24, 2014  FINDINGS: New LEFT cerebellar dense 10 mm metastasis. New 21 x 17 mm LEFT occipital lobe dense metastasis with surrounding low-density vasogenic edema. New LEFT temporal pole 10 mm dense metastasis. New LEFT occipital lobe 6 mm metastasis. New LEFT frontal 25 x 24 mm dense metastasis with surrounding vasogenic edema. At least four RIGHT frontal parietal vertex dense metastasis measuring up to 19 mm at site of prior punctate enhancement. New LEFT parietal 13 x 18 mm dense metastasis.  Low-density RIGHT frontal, RIGHT parietal occipital a treated metastasis relatively unchanged in size. RIGHT frontal resection cavity.  Small amount of layering blood products in the LEFT occipital horn. No hydrocephalus. 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 Confluent supratentorial white matter hypodensities suggest vasogenic edema and a, posttreatment changes. No acute large vascular territory infarct.  No abnormal extra-axial fluid collections. Moderate to severe calcific atherosclerosis of the carotid siphons. No paranasal sinus air-fluid levels. RIGHT frontal craniotomy and burr hole. No skull fracture.  IMPRESSION: At least 1 infratentorial and 9 supratentorial new hemorrhagic metastasis. Local mass effect without midline shift.  Small amount of intraventricular blood products without hydrocephalus.   Acute findings discussed with and reconfirmed by Dr.MCNEILL [facility] on 08/25/2014 at 4:52 am.   Electronically Signed   By: Elon Alas   On: 08/25/2014 04:55   Ct Angio Chest Pe W/cm &/or Wo Cm  08/22/2014   CLINICAL DATA:  Shortness of breath. History of lung cancer and brain metastasis.  EXAM: CT ANGIOGRAPHY CHEST WITH CONTRAST  TECHNIQUE: Multidetector CT imaging of the chest was performed using the standard protocol during bolus administration of intravenous contrast. Multiplanar CT image reconstructions and MIPs were obtained to evaluate the vascular anatomy.  CONTRAST:  167mL OMNIPAQUE IOHEXOL 350 MG/ML SOLN  COMPARISON:  Chest CT 07/03/2014  FINDINGS: Chest wall: No chest wall mass, supraclavicular or axillary lymphadenopathy. The thyroid gland is grossly normal. The bony thorax is intact. No destructive bone lesions or spinal canal compromise.  Mediastinum: The heart is normal in size. No pericardial effusion. 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 Significant progression of left hilar and mediastinal tumor. The left hilar mass measures approximately 4.6 x 3.8 cm in the subcarinal adenopathy measures 2.6 x 2.1 cm. There is moderate compression of the left lower lobe pulmonary artery. The esophagus is grossly normal.  Lungs: Severe emphysematous changes are again demonstrated. Stable right upper lobe scarring changes. New vague density in the left upper lobe is more likely scarring or atelectasis. The thick walled cavitary lesion in the left lower lobe has enlarged. It previously measured 4.3 x 3.7 cm and now measures 5.1 x 5.0 cm. There is surrounding marked interstitial thickening which could be obstructive pneumonitis or interstitial spread of tumor. New pulmonary nodule at the right lung base  is likely metastatic disease.  Upper abdomen:  No significant findings.  Review of the MIP images confirms  the above findings.  IMPRESSION: Enlarging cavitary mass in the superior segment of the left lower lobe with progressive surrounding interstitial changes which could be due to obstructive pneumonitis or interstitial spread of tumor.  Significant progression of mediastinal and left hilar lymphadenopathy with compression of the lower lobe left pulmonary artery.  New pulmonary nodule at the right lower lobe is suspicious for metastasis.  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   Electronically Signed   By: Kalman Jewels M.D.   On: 08/22/2014 13:06   Dg Chest Port 1 View  08/24/2014   CLINICAL DATA:  Diffuse chest pain with deep inspiration, worse for 2 days. Lung cancer.  EXAM: PORTABLE CHEST - 1 VIEW  COMPARISON:  Chest CT and x-ray 08/22/2014  FINDINGS: Cavitary mass lesion again noted in the left mid lung. Associated soft tissue within the cavitary lesion has increased, possibly fluid within the cavity. Left hilar fullness compatible with mass/ adenopathy as seen on prior CT. Scarring in the apices. No acute opacities on the right. No effusions. Heart is normal size.  IMPRESSION: Cavitary and left hilar mass again noted in the left lung. Possible with increasing fluid within the cavitary mass since prior study.   Electronically Signed   By: Rolm Baptise M.D.   On: 08/24/2014 15:16   Dg Chest Port 1 View  08/22/2014   CLINICAL DATA:  History of left-sided lung cancer with metastatic disease to the brain. Subsequent encounter. Chest pain with deep breath scarring at 1800 hrs yesterday.  EXAM: PORTABLE CHEST - 1 VIEW  COMPARISON:  07/07/2014.  FINDINGS: 1033 hrs. Cavitary lesion in the left lower lobe has progressed in the interval although the airspace disease peripheral to the lesion has decreased. Multiple skin folds overlie the lateral left lung but no definite left-sided pneumothorax. Right lung shows no edema or focal airspace consolidation. Interstitial markings are diffusely  coarsened with chronic features. The cardiopericardial silhouette is within normal limits for size. Telemetry leads overlie the chest.  IMPRESSION: Interval progression of cavitary left lung lesion although airspace disease peripheral to the lesion has decreased since the prior chest x-ray.   Electronically Signed   By: Misty Stanley M.D.   On: 08/22/2014 10:56    Impression:  In light of this information, I do not think there are interventions with radiation or systemic therapy that would add to this patient's quality of life or survival.  I agree with steroid increase to assess for palliative improvement in cognition if cerebral edema improves.   Also, agree with family desire for hospice enrollment.  Plan:  I spoke with the patient's wife by telephone.  At this point, the patient is set up to proceed with hospice evaluation.  ________________________________  Sheral Apley Tammi Klippel, M.D.

## 2014-08-25 NOTE — Progress Notes (Signed)
Gluco-stabilizer d/c at this time. NS started at 125cc/hr. Pain medicine given.

## 2014-08-25 NOTE — Progress Notes (Signed)
DIAGNOSIS: Stage IV (T2a, N1, M1b) non-small cell lung cancer, poorly differentiated carcinoma with glandular and squamous differentiation diagnosed in November 2015 presented with right lower lobe cavitary lesion in addition to left hilar lymphadenopathy and metastatic brain lesions.  PRIOR THERAPY: 1) stereotactic radio surgery to 3 brain lesions under the care of Dr. Tammi Klippel on 07/21/2014. 2) Right frontal craniotomy for resection of brain metastasis utilizing frameless stereotactic navigation under the care of Dr. Sherley Bounds on 07/23/2014.   CURRENT THERAPY: None.  Subjective: The patient is seen and examined today. His wife was at the bedside. He is a very unfortunate 69 years old white male with metastatic poorly differentiated non-small cell lung cancer. The patient is status post a stereotactic radiotherapy to 3 brain lesions and addition to right frontal craniotomy for resection of metastatic disease. His recent PET scan showed suspicious cecal mass and the patient was referred to gastroenterology for evaluation. Unfortunately he was getting weaker and was unable to undergo a colonoscopy. He was admitted to [facility] with seizure activity and repeat CT scan of the head showed multiple new hemorrhagic brain lesions. CT scan of the chest also showed evidence for disease progression. His performance status is declining rapidly.  Objective: Vital signs in last 24 hours: Temp:  [99.3 F (37.4 C)-100.7 F (38.2 C)] 100.7 F (38.2 C) (01/18 1825) Pulse Rate:  [87-143] 87 (01/18 1825) Resp:  [20-28] 20 (01/18 1825) BP: (109-166)/(62-97) 127/62 mmHg (01/18 1825) SpO2:  [94 %-100 %] 100 % (01/18 1825)  Intake/Output from previous day: 01/17 0701 - 01/18 0700 In: 4.7 [I.V.:4.7] Out: 1250 [Urine:1250] Intake/Output this shift:    General appearance: appears older than stated age, delirious, fatigued and no distress Resp: clear to auscultation bilaterally Cardio: regular rate and  rhythm, S1, S2 normal, no murmur, click, rub or gallop GI: soft, non-tender; bowel sounds normal; no masses,  no organomegaly Extremities: extremities normal, atraumatic, no cyanosis or edema  Lab Results:   Recent Labs  08/24/14 1445 08/25/14 0105  WBC 20.5* 18.3*  HGB 8.9* 8.7*  HCT 26.0* 24.9*  PLT 130* 121*   BMET  Recent Labs  08/25/14 0231 08/25/14 0650  NA 129* 128*  K 4.0 3.7  CL 100 100  CO2 25 24  GLUCOSE 172* 248*  BUN 21 22  CREATININE 0.58 0.73  CALCIUM 8.1* 7.8*    Studies/Results: Ct Head Wo Contrast  08/25/2014   CLINICAL DATA:  New onset seizure. History of lung cancer with brain metastasis.  EXAM: CT HEAD WITHOUT CONTRAST  TECHNIQUE: Contiguous axial images were obtained from the base of the skull through the vertex without intravenous contrast.  COMPARISON:  MRI of the head July 24, 2014  FINDINGS: New LEFT cerebellar dense 10 mm metastasis. New 21 x 17 mm LEFT occipital lobe dense metastasis with surrounding low-density vasogenic edema. New LEFT temporal pole 10 mm dense metastasis. New LEFT occipital lobe 6 mm metastasis. New LEFT frontal 25 x 24 mm dense metastasis with surrounding vasogenic edema. At least four RIGHT frontal parietal vertex dense metastasis measuring up to 19 mm at site of prior punctate enhancement. New LEFT parietal 13 x 18 mm dense metastasis.  Low-density RIGHT frontal, RIGHT parietal occipital a treated metastasis relatively unchanged in size. RIGHT frontal resection cavity.  Small amount of layering blood products in the LEFT occipital horn. No hydrocephalus. 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 Confluent supratentorial white matter hypodensities suggest vasogenic edema and a, posttreatment changes. No acute large  vascular territory infarct.  No abnormal extra-axial fluid collections. Moderate to severe calcific atherosclerosis of the carotid siphons. No paranasal sinus air-fluid levels. RIGHT frontal craniotomy and  burr hole. No skull fracture.  IMPRESSION: At least 1 infratentorial and 9 supratentorial new hemorrhagic metastasis. Local mass effect without midline shift.  Small amount of intraventricular blood products without hydrocephalus.  Acute findings discussed with and reconfirmed by Dr.MCNEILL [facility] on 08/25/2014 at 4:52 am.   Electronically Signed   By: Elon Alas   On: 08/25/2014 04:55   Dg Chest Port 1 View  08/24/2014   CLINICAL DATA:  Diffuse chest pain with deep inspiration, worse for 2 days. Lung cancer.  EXAM: PORTABLE CHEST - 1 VIEW  COMPARISON:  Chest CT and x-ray 08/22/2014  FINDINGS: Cavitary mass lesion again noted in the left mid lung. Associated soft tissue within the cavitary lesion has increased, possibly fluid within the cavity. Left hilar fullness compatible with mass/ adenopathy as seen on prior CT. Scarring in the apices. No acute opacities on the right. No effusions. Heart is normal size.  IMPRESSION: Cavitary and left hilar mass again noted in the left lung. Possible with increasing fluid within the cavitary mass since prior study.   Electronically Signed   By: Rolm Baptise M.D.   On: 08/24/2014 15:16    Medications: I have reviewed the patient's current medications.  CODE STATUS: No CODE BLUE  Assessment/Plan: This is a very pleasant 69 years old white male with metastatic non-small cell lung cancer and recurrent hemorrhagic brain metastasis. Unfortunately the patient has very aggressive disease and his condition has been declining rapidly. I had a lengthy discussion with the wife today about his current disease status and treatment options. I doubt the patient would be a candidate for any systemic treatment at this point. I strongly recommended for the patient his wife to consider palliative care and hospice at this point. The wife is in agreement with the current plan and she would like to consider him for [facility] disposition. Continue current supportive care  and seizure medication. Thank you so much for taking good care of Anthony Lopez. Please call if you have any questions.   LOS: 1 day    Dhilan Brauer K. 08/25/2014

## 2014-08-25 NOTE — Progress Notes (Signed)
Patient states that he is having pain in his chest. RN assessed patient and asked if he feels like the pain is a cardiac pain- patient stated "no, I have been coughing and breathing hard through out the day." RN educated patient that MD can be notified if pain medication did not work for pain. Patient stated that the morphine had been doing just fine. Will continue to monitor

## 2014-08-25 NOTE — Consult Note (Signed)
Patient Anthony Lopez      DOB: 04-28-1946      AVW:[id number]     Consult Note from the Palliative Medicine Team at [facility] Requested by: Dr Wynelle Cleveland    PCP: Anthony Seller, MD Reason for Consultation: Hospice     Phone Number:[phone]  Assessment/Recommendations: 69 yo male with Stage IV NSCLC with brain mets who presented with weakness, hyperglycemia, dehydration on 1/17. Seizures overnight and found to have hemorrhagic brain mets with worsening disease in chest. Palliative consulted to discuss hospice care.     1.  Code Status: DNR  2. GOC:  Spoke at length with wife Anthony Lopez as well as 2 daughters today. They described how rapidly [name] has decline since the beginning of December around the time his cancer was diagnosed.  They feel like he was really struggling at home and that he was frustrated with his QOL prior to this hospitalization.  They know he would be frustrated to have his life prolonged (though they themselves admit wishing to keep him around as long as possible). After much discussion they have decided to make him full comfort care only.  We talked about issues related to insulin drip and checking blood sugars. I expressed that even intermittently checking his sugars and PRN insulin may prolong his life, but not sure with what they told me that would be prolonging QOL.  I assured them there were other medications we could give to ensure comfort if dehydration occurs form low blood sugars (a major concern his wife had). One his daughters pointed out that he wouldn't want that, and that it doesn't make sense to keep putting him through finger sticks,a dn subQ insulin injections.  Some of his current encephalopathy could be from post-ictal state, but with being ~12h out from last seizure, I am not sure how much improvement we will see in his mentation. Ultimately, if he does have significant improvement in mental state, glycemic management can again be addressed.   At this time, family would like residential hospice facility to be explored and focus to be on comfort measures only.    3. Symptom Management:   1. Pain: PRN morphine 2. Seizures: 2/2 brain mets. Continue steroids. I think daily dosing is fine. PRN ativan. Keppra. If hospice facility can not do Keppra, would be reasonable to schedule ativan  4. Psychosocial/Spiritual: Married, 2 daughters, 2 step-sons.  Local pastor involved with pt/family and they feel in good place there.     Brief HPI: 69 yo male with PMHx of Stage IV NSCLC, tobacco abuse.  Cancer diagnosis few months ago. Has undergone XRT to brain mets as well as craniotomy with resection of mets.  Also suspected to have liver/abdoinal mets.  Presented yesterday with weakness. Found to be dehydrated, hyperglycemic, dehydrated.  At time of admission, some concern of chest pain which reportedly resolved.  Also had some headaches.  Developed new onset seizures overnight with CT revealing worsening brain metastasis.  Chest CT reveals enlarging cavitary chest lesion with concern for interstitial spread of disease. Of note, he also had cecal mass with plan for colonoscopy/bx but too weak to undergo eval.  After events overnight, family reportedly interested in hospice care, so palliative care consultation requested.     ROS: Unable to obtain with acute encephalopathy    PMH:  Past Medical History  Diagnosis Date  . CVA (cerebral infarction) 2000  . HTN (hypertension), benign   . Rheumatic fever  Age 52 or 53  . Tobacco abuse   . Lung cancer   . Brain cancer   . Anxiety   . Headache   . Dizziness   . LBBB (left bundle branch block)   . Bilateral carotid bruits   . Subclavian artery stenosis   . Metastasis to colon   . Metastasis to liver      PSH: Past Surgical History  Procedure Laterality Date  . Transthoracic echocardiogram  January 2004    Normal LV size and function. EF 55-65%.  . Carotid dopplers Bilateral  07/18/2013    Right subclavian 0-49%, left subclavian mild bilateral carotid stenosis..  . 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   . Mohs surgery      h/o NMSC (scc or bcc)  . Nose surgery      cancer removed  . Hernia repair    . Craniotomy Right 07/23/2014    Procedure: RIGHT CRANIOTOMY FOR TUMOR WITH CURVE;  Surgeon: Eustace Moore, MD;  Location: [facility];  Service: Neurosurgery;  Laterality: Right;  RIGHT CRANIOTOMY FOR TUMOR WITH CURVE   I have reviewed the [place] and SH and  If appropriate update it with new information. Allergies  Allergen Reactions  . Doxycycline     Stay asleep   . Penicillins Rash    Childhood reaction   Scheduled Meds: . antiseptic oral rinse  7 mL Mouth Rinse q12n4p  . chlorhexidine  15 mL Mouth Rinse BID  . dexamethasone  4 mg Intravenous 4 times per day  . enoxaparin (LOVENOX) injection  40 mg Subcutaneous Q24H  . insulin aspart  0-9 Units Subcutaneous TID WC  . [START ON 08/27/2014] insulin regular  0-10 Units Intravenous TID WC  . levETIRAcetam  500 mg Intravenous Q12H  . LORazepam      . omega-3 acid ethyl esters  1 g Oral Daily   Continuous Infusions: . sodium chloride 1,000 mL (08/25/14 0800)  . sodium chloride 125 mL/hr at 08/25/14 0131  . sodium chloride 125 mL/hr at 08/25/14 0504  . dextrose 5 % and 0.45% NaCl 125 mL/hr at 08/25/14 1200  . insulin (NOVOLIN-R) infusion 2.2 mL/hr at 08/25/14 1200   PRN Meds:.acetaminophen **OR** acetaminophen, dextrose, morphine injection    BP 109/74 mmHg  Pulse 110  Temp(Src) 99.3 F (37.4 C) (Oral)  Resp 25  Ht 5\' 10"  (1.778 m)  Wt 56.246 kg (124 lb)  BMI 17.79 kg/m2  SpO2 98%   PPS: 10   Intake/Output Summary (Last 24 hours) at 08/25/14 1313 Last data filed at 08/25/14 1200  Gross per 24 hour  Intake 2118.95 ml  Output   1250 ml  Net 868.95 ml   Physical Exam:  General:  Drowsy, confused HEENT:  [place], mmm Neck: supple CVS: tachycardia Ext: warm Neuro: confused, unable to follow commands, fluctuating level of responsiveness  Labs: CBC    Component Value Date/Time   WBC 18.3* 08/25/2014 0105   WBC 10.7* 08/05/2014 0948   RBC 2.71* 08/25/2014 0105   RBC 3.86* 08/05/2014 0948   HGB 8.7* 08/25/2014 0105   HGB 11.9* 08/05/2014 0948   HCT 24.9* 08/25/2014 0105   HCT 36.0* 08/05/2014 0948   PLT 121* 08/25/2014 0105   PLT 200 08/05/2014 0948   MCV 91.9 08/25/2014 0105   MCV 93.4 08/05/2014 0948   MCH 32.1 08/25/2014 0105   MCH 31.0  08/05/2014 0948   MCHC 34.9 08/25/2014 0105   MCHC 33.1 08/05/2014 0948   RDW 15.1 08/25/2014 0105   RDW 13.8 08/05/2014 0948   LYMPHSABS 1.5 08/24/2014 1445   LYMPHSABS 1.8 08/05/2014 0948   MONOABS 1.6* 08/24/2014 1445   MONOABS 0.7 08/05/2014 0948   EOSABS 0.1 08/24/2014 1445   EOSABS 0.2 08/05/2014 0948   BASOSABS 0.0 08/24/2014 1445   BASOSABS 0.1 08/05/2014 0948    BMET    Component Value Date/Time   NA 128* 08/25/2014 0650   NA 133* 08/05/2014 0948   K 3.7 08/25/2014 0650   K 3.8 08/05/2014 0948   CL 100 08/25/2014 0650   CO2 24 08/25/2014 0650   CO2 27 08/05/2014 0948   GLUCOSE 248* 08/25/2014 0650   GLUCOSE 468* 08/05/2014 0948   BUN 22 08/25/2014 0650   BUN 30.9* 08/05/2014 0948   CREATININE 0.73 08/25/2014 0650   CREATININE 1.1 08/05/2014 0948   CALCIUM 7.8* 08/25/2014 0650   CALCIUM 9.0 08/05/2014 0948   GFRNONAA >90 08/25/2014 0650   GFRAA >90 08/25/2014 0650    CMP     Component Value Date/Time   NA 128* 08/25/2014 0650   NA 133* 08/05/2014 0948   K 3.7 08/25/2014 0650   K 3.8 08/05/2014 0948   CL 100 08/25/2014 0650   CO2 24 08/25/2014 0650   CO2 27 08/05/2014 0948   GLUCOSE 248* 08/25/2014 0650   GLUCOSE 468* 08/05/2014 0948   BUN 22 08/25/2014 0650   BUN 30.9* 08/05/2014 0948   CREATININE 0.73 08/25/2014 0650   CREATININE 1.1 08/05/2014 0948   CALCIUM 7.8* 08/25/2014 0650    CALCIUM 9.0 08/05/2014 0948   PROT 5.5* 08/24/2014 1445   PROT 6.3* 08/05/2014 0948   ALBUMIN 2.5* 08/24/2014 1445   ALBUMIN 2.6* 08/05/2014 0948   AST 68* 08/24/2014 1445   AST 10 08/05/2014 0948   ALT 114* 08/24/2014 1445   ALT 19 08/05/2014 0948   ALKPHOS 237* 08/24/2014 1445   ALKPHOS 89 08/05/2014 0948   BILITOT 0.9 08/24/2014 1445   BILITOT 0.35 08/05/2014 0948   GFRNONAA >90 08/25/2014 0650   GFRAA >90 08/25/2014 0650   1/15 CTA Chest IMPRESSION: Enlarging cavitary mass in the superior segment of the left lower lobe with progressive surrounding interstitial changes which could be due to obstructive pneumonitis or interstitial spread of tumor.  Significant progression of mediastinal and left hilar lymphadenopathy with compression of the lower lobe left pulmonary artery.  New pulmonary nodule at the right lower lobe is suspicious for metastasis.  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  1/18 CT Head IMPRESSION:  At least 1 infratentorial and 9 supratentorial new hemorrhagic  metastasis. Local mass effect without midline shift.  Small amount of intraventricular blood products without  hydrocephalus.   Total Time: 85 minutes Greater than 50%  of this time was spent counseling and coordinating care related to the above assessment and plan.  Doran Clay D.O. Palliative Medicine Team at [facility]  Pager: [phone] Team Phone: [phone]

## 2014-08-25 NOTE — Progress Notes (Addendum)
Shift Event Paged secondary to pt with seizure activity. Per RN and family, pt was having full body jerking that lasted less than a minute, no LOC, or tongue biting. VSS, CBG 134.  At bedside, seizure activity has resolved, pt post-ictal, awake but lethargic, confused. Facial movement decreased on left, with weakness of left side.   Neurology, Dr. Leonel Ramsay at bedside.   New Onset Seizure/altered mental status -Pt with lung CA with bain/lung/cecal mets.   -Ct head- with new hemorrhagic metastasis.    -Neurology evaluated, rec's- Given IV Keppra 1000mg  x1 and IV Decadron 10mg  x1.  Placed on Keppra 500mg  oral BID, Decadrom 4mg  IV q 6hrs. Will likely need antiepileptic chronically. -Placed back on glucostabilizer- transitioned to [place] earlier.  [place] [facility] [phone]

## 2014-08-25 NOTE — Care Management Note (Signed)
  Page 1 of 1   08/25/2014     4:35:46 PM CARE MANAGEMENT NOTE 08/25/2014  Patient:  Anthony Lopez, Anthony Lopez   Account Number:  [id number]  Date Initiated:  08/25/2014  Documentation initiated by:  Robertta Halfhill  Subjective/Objective Assessment:   HA, Hyperglycemia,  stage 4 NSCLC -metastatic disease: brain/lung mets and cecal mass, Leukocytosis, elevated liver enzymes and troponin, normocytic anemia, hypertension     Action/Plan:   CM to follow for disposition needs   Anticipated DC Date:  08/28/2014   Anticipated DC Plan:  HOME W [facility] CARE         Choice offered to / List presented to:             Status of service:  In process, will continue to follow Medicare Important Message given?  NA - LOS <3 / Initial given by admissions (If response is "NO", the following Medicare IM given date fields will be blank) Date Medicare IM given:   Medicare IM given by:   Date Additional Medicare IM given:   Additional Medicare IM given by:    Discharge Disposition:    Per UR Regulation:  Reviewed for med. necessity/level of care/duration of stay  If discussed at [place] of Stay Meetings, dates discussed:    Comments:  Sheria Rosello RN, BSN, MSHL, CCM  Nurse - Case Manager,  [phone]  08/25/2014 Disposition Plan: home w hospice vs hospice home

## 2014-08-25 NOTE — Progress Notes (Signed)
  SHIFT EVENT: RN heard wife screaming from down the hallway. RN went into patient's room to find patient seizing. At this time RN recorded that the seizure lasted about a minute and a half. Patient's left arm was across chest and his entire body was seizing. Ativan was being prepared while MD was called. Neurologist Anthony Lopez was also paged. April from [facility] arrived on the scene first. Patient's vitals were taken and patient was assessed at this time. Anthony Lopez was able to state day, date, and time. Wife was by his side during this period.    VITALS: 166/97 b/p, 143 pulse ----> 114/71 b/p, 110 pulse ----> 100.7 temp, 117/7 b/p, 119 pulse. CBG >200     PLAN: CT of the head. Place on gluco-stabilizer. NS @ 125

## 2014-08-25 NOTE — Progress Notes (Signed)
[facility] Progress Note   Anthony Lopez WNI:[id number] DOB: Apr 28, 1946 DOA: 08/24/2014 PCP: Woody Seller, MD  Brief narrative: Anthony Lopez is a 69 y.o. male with a history of stage IV metastatic carcinoma, hypertension, but presented to the emergency department with complaints of generalized weakness. Patient was found to have hyperglycemia and be very dehydrated. He was admitted and then had a seizure early in the AM.    Subjective: He is now post ictal and sleepy. Difficult to keep awake.   Assessment/Plan: Principal Problem:   Hyperglycemia - placed on insulin infusion- cont to follow sugars - hb A1c- 9.3.   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 for chemo- has been receiving radiation to the brain for metastasis but now has new mets to the brain-  - have spoken with Dr Julien Nordmann and Dr Tammi Klippel - both are recommending hospice at this time - palliative care consulted- will likely pass in the hospital or need hospice home  Seizure-  - cont Keppra- Decadron needed to be increased to prevent further seizures as well     HTN (hypertension) - BP Low- hold oral meds  Fever/ leukocytosis - aspiration?- vs due to cancer - recommend comfort care    Code Status: DNR Family Communication: with wife in detail Disposition Plan: home w hospice vs hospice home DVT prophylaxis: Lovenox  Consultants: Palliative care  Procedures: Antibiotics: Anti-infectives    None     Objective: Filed Weights   08/24/14 1317  Weight: 56.246 kg (124 lb)    Intake/Output Summary (Last 24 hours) at 08/25/14 1226 Last data filed at 08/25/14 1200  Gross per 24 hour  Intake 2118.95 ml  Output   1250 ml  Net 868.95 ml     Vitals Filed Vitals:   08/25/14 0332 08/25/14 0345 08/25/14 0446 08/25/14 0607  BP: 166/97 114/71 117/71 109/74  Pulse: 143 110 119 110  Temp:   100.7 F (38.2 C) 99.3 F (37.4 C)  TempSrc:    Oral  Resp: 20  28 25   Height:      Weight:       SpO2: 98% 100% 94% 98%    Exam: General: lethargic, No acute respiratory distress Lungs: Clear to auscultation bilaterally without wheezes or crackles Cardiovascular: Regular rate and rhythm without murmur gallop or rub normal S1 and S2 Abdomen: Nontender, nondistended, soft, bowel sounds positive, no rebound, no ascites, no appreciable mass Extremities: No significant cyanosis, clubbing, or edema bilateral lower extremities  Data Reviewed: Basic Metabolic Panel:  Recent Labs Lab 08/24/14 1445 08/24/14 2005 08/24/14 2300 08/25/14 0105 08/25/14 0231 08/25/14 0650  NA 127* 132* 132* 131* 129* 128*  K 4.7 3.9 4.1 3.9 4.0 3.7  CL 91* 99 98 98 100 100  CO2 25 25 25 26 25 24   GLUCOSE 572* 240* 242* 139* 172* 248*  BUN 29* 24* 25* 23 21 22   CREATININE 0.81 0.77 0.72 0.68 0.58 0.73  CALCIUM 8.7 7.9* 8.1* 8.1* 8.1* 7.8*  MG 1.9  --   --   --   --  1.7   Liver Function Tests:  Recent Labs Lab 08/24/14 1445  AST 68*  ALT 114*  ALKPHOS 237*  BILITOT 0.9  PROT 5.5*  ALBUMIN 2.5*   No results for input(s): LIPASE, AMYLASE in the last 168 hours. No results for input(s): AMMONIA in the last 168 hours. CBC:  Recent Labs Lab 08/22/14 1035 08/24/14 1445 08/25/14 0105  WBC 16.9* 20.5* 18.3*  NEUTROABS  14.6* 17.3*  --   HGB 11.4* 8.9* 8.7*  HCT 33.2* 26.0* 24.9*  MCV 91.5 89.3 91.9  PLT 195 130* 121*   Cardiac Enzymes:  Recent Labs Lab 08/22/14 1035 08/24/14 1445 08/24/14 2005 08/25/14 0105 08/25/14 0650  TROPONINI <0.03 0.06* <0.03 <0.03 0.08*   BNP (last 3 results) No results for input(s): PROBNP in the last 8760 hours. CBG:  Recent Labs Lab 08/25/14 0718 08/25/14 0805 08/25/14 0920 08/25/14 1031 08/25/14 1137  GLUCAP 212* 160* 129* 152* 168*    No results found for this or any previous visit (from the past 240 hour(s)).   Studies:  Recent x-ray studies have been reviewed in detail by the Attending Physician  Scheduled Meds:  Scheduled  Meds: . antiseptic oral rinse  7 mL Mouth Rinse q12n4p  . chlorhexidine  15 mL Mouth Rinse BID  . dexamethasone  4 mg Intravenous 4 times per day  . enoxaparin (LOVENOX) injection  40 mg Subcutaneous Q24H  . insulin aspart  0-9 Units Subcutaneous TID WC  . [START ON 08/27/2014] insulin regular  0-10 Units Intravenous TID WC  . levETIRAcetam  500 mg Oral BID  . lisinopril  10 mg Oral Daily  . LORazepam      . omega-3 acid ethyl esters  1 g Oral Daily   Continuous Infusions: . sodium chloride 1,000 mL (08/25/14 0800)  . sodium chloride 125 mL/hr at 08/25/14 0131  . sodium chloride 125 mL/hr at 08/25/14 0504  . dextrose 5 % and 0.45% NaCl 125 mL/hr at 08/25/14 1200  . insulin (NOVOLIN-R) infusion 2.2 mL/hr at 08/25/14 1200    Time spent on care of this patient: 73 min   Kevin, MD 08/25/2014, 12:26 PM  LOS: 1 day   [facility] Office  [phone] Pager - Text Page per [email or web address]  If 7PM-7AM, please contact night-coverage [email or web address]

## 2014-08-25 NOTE — Consult Note (Signed)
Neurology Consultation Reason for Consult: Seizure Referring Physician: Jyl Heinz  CC: Seizure  History is obtained from: Wife, nurses  HPI: Anthony Lopez is a 69 y.o. male with a history of non-small cell lung cancer with metastasis to the brain status post debulking of the largest. On previous scans are as been extensive edema. His wife reports that he has been getting weaker on the left side progressively over the past week. He was just restarted on dexamethasone, however this was held on admission due to hyperglycemia requiring a glucose stabilizer.  Tonight, he had a seizure which generalized. He is currently postictal  He is able to answer some questions such as telling me that he has never had a seizure before. But he gets confused with some other questions.  ROS:  Unable to obtain due to altered mental status.   Past Medical History  Diagnosis Date  . CVA (cerebral infarction) 2000  . HTN (hypertension), benign   . Rheumatic fever     Age 62 or 60  . Tobacco abuse   . Lung cancer   . Brain cancer   . Anxiety   . Headache   . Dizziness   . LBBB (left bundle branch block)   . Bilateral carotid bruits   . Subclavian artery stenosis   . Metastasis to colon   . Metastasis to liver     Family History: Unable to obtain due to altered mental status.  Social History: Tob: Unable to obtain due to altered mental status.  Exam: Current vital signs: BP 166/97 mmHg  Pulse 143  Temp(Src) 98.5 F (36.9 C) (Oral)  Resp 20  Ht 5\' 10"  (1.778 m)  Wt 56.246 kg (124 lb)  BMI 17.79 kg/m2  SpO2 98% Vital signs in last 24 hours: Temp:  [97.6 F (36.4 C)-98.5 F (36.9 C)] 98.5 F (36.9 C) (01/17 2234) Pulse Rate:  [66-143] 143 (01/18 0332) Resp:  [16-25] 20 (01/18 0332) BP: (98-166)/(58-97) 166/97 mmHg (01/18 0332) SpO2:  [96 %-100 %] 98 % (01/18 0332) FiO2 (%):  [28 %] 28 % (01/17 1902) Weight:  [56.246 kg (124 lb)] 56.246 kg (124 lb) (01/17 1317)   Physical Exam   Constitutional: Appears well-developed and well-nourished.  Psych: Affect appropriate to situation Eyes: No scleral injection HENT: No OP obstrucion Head: Normocephalic.  Cardiovascular: Normal rate and regular rhythm.  Respiratory: Effort normal and breath sounds normal to anterior ascultation GI: Soft.  No distension. There is no tenderness.  Skin: WDI  Neuro: Mental Status: Patient is awake, but lethargic He has a dense left hemi-neglect Cranial Nerves: II: Visual Fields -left hemianopia. Right pupil is slightly smaller than the left, both are reactive to light.  He does fixate, but denies being able to see. III,IV, VI: Right gaze preference, occasionally does cross midline to the left. V: Facial sensation is decreased on left  VII: Facial movement is decreased on left VIII, X, XI, XII: Unable to assess secondary to patient's altered mental status.  Motor: Tone is decreased on left. Bulk is normal. 5/5 strength was present on the right, he has a flaccid hemiparesis on the left, but does withdraw some to noxious stimuli Sensory: Withdraws minimally on the left, stimulation applied to the left is sensed as coming from the right. Cerebellar: Unable to obtain due to altered mental status.   I have reviewed labs in epic and the results pertinent to this consultation are: Bmp - mildly hyponatremic  I have reviewed the images obtained:CT head -  multiple new areas of hemorrhagic metastasis.   Impression: 69 yo M with aggressive metastatic lung cancer s/p radiosurgery. He now has mets in both hemispheres+posterior fossa including multiple hemorrhagic mets. He will need to be on anti-epileptic from here on. I would also favor restarting decadron. I have discussed this with the wife, and apparently there was already some discussion of hospice which given the marked progression on CT may be a reasonable course to consider, though will defer to oncology if they will offer any further  treatment options.   Recommendations: 1) Decadron 4mg  q6h 2) keppra 500mg  BID 3) will continue to follow.    Roland Rack, MD [facility] [phone]  If 7pm- 7am, please page neurology on call as listed in [place].

## 2014-08-26 MED ORDER — WHITE PETROLATUM GEL
Status: AC
  Administered 2014-08-26: 0.2
  Filled 2014-08-26: qty 1

## 2014-08-26 NOTE — Clinical Social Work Note (Signed)
Per [facility], they can admit patient to hospice home on 08/27/14. CSW will leave report for covering CSW.    Liz Beach MSW, [place], [place], [phone]

## 2014-08-26 NOTE — Clinical Social Work Psychosocial (Signed)
Clinical Social Work Department BRIEF PSYCHOSOCIAL ASSESSMENT 08/26/2014  Patient:  Anthony Lopez, Anthony Lopez     Account Number:  [id number]     Admit date:  08/24/2014  Clinical Social Worker:  Lovey Newcomer  Date/Time:  08/26/2014 03:31 PM  Referred by:  Physician  Date Referred:  08/26/2014 Referred for  Residential hospice placement   Other Referral:   NA   Interview type:  Patient Other interview type:   Patient and family interviewed at bedside to complete assessment.    PSYCHOSOCIAL DATA Living Status:  WIFE Admitted from facility:   Level of care:   Primary support name:  Curt Bears Primary support relationship to patient:  SPOUSE Degree of support available:   Support is strong.    CURRENT CONCERNS Current Concerns  Post-Acute Placement   Other Concerns:   NA    SOCIAL WORK ASSESSMENT / PLAN Patient and family interviewed at bedside to complete assessment. Patient and family are asking for [facility] placement at discharge. Patient appears to be in some discomfort but family is showing great support to patient. CSW explained residential hospice placement process and answered family's questions. Family states that if [facility] is not available at discharge, they will take the patient home.   Assessment/plan status:  Psychosocial Support/Ongoing Assessment of Needs Other assessment/ plan:   Make referrals   Information/referral to community resources:   CSW contact information given.    PATIENT'S/FAMILY'S RESPONSE TO PLAN OF CARE: Patient and patient's family plans for the patient to DC to [facility] once discharged.       Liz Beach MSW, [place], [place], [phone]

## 2014-08-26 NOTE — Progress Notes (Signed)
[facility] Progress Note   WACO FOERSTER XNA:[id number] DOB: 1946-06-03 DOA: 08/24/2014 PCP: Woody Seller, MD  Brief narrative: Anthony Lopez is a 69 y.o. male with a history of stage IV metastatic carcinoma, hypertension, but presented to the emergency department with complaints of generalized weakness. Patient was found to have hyperglycemia and be very dehydrated. He was admitted and then had a seizure early in the AM.    Subjective: Awake and alert today. Discussed plan to go to [facility] when bed available. He is currently in no pain.   Assessment/Plan: Principal Problem:   Hyperglycemia due to Decadron - was initially placed on an insulin infusion but due to comfort care, no longer checking sugars - hb A1c- 9.3.   Active Problems: [place] lung CA - not a candidate for chemo- has been receiving radiation to the brain for metastasis but now has new mets to the brain-  - have spoken with Dr Julien Nordmann and Dr Tammi Klippel - both are recommending hospice at this time - palliative care consulted- will go to [facility]  Seizure-  - cont Keppra- Decadron needed to be increased to prevent further seizures as well   HTN (hypertension) - BP Low- hold oral meds  Fever/ leukocytosis - aspiration?- vs due to underlying cancer - recommend comfort care    Code Status: DNR Family Communication: with wife in detail Disposition Plan: hospice home DVT prophylaxis: none due to comfort care  Consultants: Palliative care  Procedures: Antibiotics: Anti-infectives    None     Objective: Filed Weights   08/24/14 1317  Weight: 56.246 kg (124 lb)    Intake/Output Summary (Last 24 hours) at 08/26/14 1319 Last data filed at 08/26/14 0521  Gross per 24 hour  Intake  353.8 ml  Output    400 ml  Net  -46.2 ml     Vitals Filed Vitals:   08/25/14 0446 08/25/14 0607 08/25/14 1825 08/26/14 0520  BP: 117/71 109/74 127/62 114/62  Pulse: 119 110 87 90  Temp: 100.7 F (38.2  C) 99.3 F (37.4 C) 100.7 F (38.2 C) 99.4 F (37.4 C)  TempSrc:  Oral Oral Oral  Resp: 28 25 20 18   Height:      Weight:      SpO2: 94% 98% 100% 98%    Exam: General: alert - appear very weak, No acute respiratory distress Lungs: Clear to auscultation bilaterally without wheezes or crackles Cardiovascular: Regular rate and rhythm without murmur gallop or rub normal S1 and S2 Abdomen: Nontender, nondistended, soft, bowel sounds positive, no rebound, no ascites, no appreciable mass Extremities: No significant cyanosis, clubbing, or edema bilateral lower extremities  Data Reviewed: Basic Metabolic Panel:  Recent Labs Lab 08/24/14 1445 08/24/14 2005 08/24/14 2300 08/25/14 0105 08/25/14 0231 08/25/14 0650  NA 127* 132* 132* 131* 129* 128*  K 4.7 3.9 4.1 3.9 4.0 3.7  CL 91* 99 98 98 100 100  CO2 25 25 25 26 25 24   GLUCOSE 572* 240* 242* 139* 172* 248*  BUN 29* 24* 25* 23 21 22   CREATININE 0.81 0.77 0.72 0.68 0.58 0.73  CALCIUM 8.7 7.9* 8.1* 8.1* 8.1* 7.8*  MG 1.9  --   --   --   --  1.7   Liver Function Tests:  Recent Labs Lab 08/24/14 1445  AST 68*  ALT 114*  ALKPHOS 237*  BILITOT 0.9  PROT 5.5*  ALBUMIN 2.5*   No results for input(s): LIPASE, AMYLASE in the last 168 hours. No  results for input(s): AMMONIA in the last 168 hours. CBC:  Recent Labs Lab 08/22/14 1035 08/24/14 1445 08/25/14 0105  WBC 16.9* 20.5* 18.3*  NEUTROABS 14.6* 17.3*  --   HGB 11.4* 8.9* 8.7*  HCT 33.2* 26.0* 24.9*  MCV 91.5 89.3 91.9  PLT 195 130* 121*   Cardiac Enzymes:  Recent Labs Lab 08/22/14 1035 08/24/14 1445 08/24/14 2005 08/25/14 0105 08/25/14 0650  TROPONINI <0.03 0.06* <0.03 <0.03 0.08*   BNP (last 3 results) No results for input(s): PROBNP in the last 8760 hours. CBG:  Recent Labs Lab 08/25/14 0920 08/25/14 1031 08/25/14 1137 08/25/14 1312 08/25/14 1434  GLUCAP 129* 152* 168* 170* 140*    Recent Results (from the past 240 hour(s))  Urine culture      Status: None   Collection Time: 08/24/14  3:20 PM  Result Value Ref Range Status   Specimen Description URINE, CLEAN CATCH  Final   Special Requests NONE  Final   Colony Count   Final    2,000 COLONIES/ML Performed at [facility]    Culture   Final    INSIGNIFICANT GROWTH Performed at [facility]    Report Status 08/25/2014 FINAL  Final     Studies:  Recent x-ray studies have been reviewed in detail by the Attending Physician  Scheduled Meds:  Scheduled Meds: . antiseptic oral rinse  7 mL Mouth Rinse q12n4p  . chlorhexidine  15 mL Mouth Rinse BID  . dexamethasone  12 mg Intravenous Daily  . levETIRAcetam  500 mg Intravenous Q12H   Continuous Infusions:    Time spent on care of this patient: 45 min   [place], MD 08/26/2014, 1:19 PM  LOS: 2 days   [facility] Office  [phone] Pager - Text Page per [email or web address]  If 7PM-7AM, please contact night-coverage [email or web address]

## 2014-08-27 MED ORDER — LEVETIRACETAM 500 MG PO TABS: 500.0000 mg | ORAL_TABLET | Freq: Two times a day (BID) | ORAL | Status: AC

## 2014-08-27 NOTE — Progress Notes (Signed)
CARE MANAGEMENT NOTE 08/27/2014  Patient:  Anthony Lopez, Anthony Lopez   Account Number:  [id number]  Date Initiated:  08/25/2014  Documentation initiated by:  HUTCHINSON,CRYSTAL  Subjective/Objective Assessment:   HA, Hyperglycemia,  stage 4 NSCLC -metastatic disease: brain/lung mets and cecal mass, Leukocytosis, elevated liver enzymes and troponin, normocytic anemia, hypertension     Action/Plan:   CM to follow for disposition needs   Anticipated DC Date:  08/28/2014   Anticipated DC Plan:  [place]  CM consult      Choice offered to / List presented to:             Status of service:  Completed, signed off Medicare Important Message given?  YES (If response is "NO", the following Medicare IM given date fields will be blank) Date Medicare IM given:  08/27/2014 Medicare IM given by:  [facility] Date Additional Medicare IM given:   Additional Medicare IM given by:    Discharge Disposition:  [place]  Per UR Regulation:  Reviewed for med. necessity/level of care/duration of stay  If discussed at [place] of Stay Meetings, dates discussed:    Comments:  Crystal Hutchinson RN, BSN, MSHL, CCM  Nurse - Case Manager,  [phone]  08/25/2014 Disposition Plan: home w hospice vs hospice home

## 2014-08-27 NOTE — Consult Note (Signed)
[facility] Liaison:  [facility] available for Mr. Kolbeck today. Family completed paper work for transfer yesterday. Dr. Orpah Melter to assume care per family choice. Please fax discharge summary to [phone]. RN please call report to [phone]. Please arrange transport for patient to arrive by noon if possible. Thank you. Erling Conte LCSW [phone]

## 2014-08-27 NOTE — Discharge Summary (Signed)
Physician Discharge Summary  Anthony Lopez WNU:[id number] DOB: May 15, 1946 DOA: 08/24/2014  PCP: Woody Seller, MD  Admit date: 08/24/2014 Discharge date: 08/27/2014  Time spent: 30 minutes  Recommendations for Outpatient Follow-up:  1. Discharge to [facility]  2. If needed will leave peripheral IVs in place 3. 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 Presented to the ER with generalized weakness. He is found to be hyperglycemic and dehydrated. Subsequently admitted to the hospital. On hospital day one had seizure activity in setting of known metastatic brain lesion.  Hospital Course:  Hyperglycemia secondary to Decadron -Initially required insulin infusion but since was transitioned to comfort care, no longer checking CBGs. 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 Julien Nordmann this admission. Not a candidate for chemotherapy. Had been receiving radiation to the brain for metastasis but has new metastases to the brain. Dr. Julien Nordmann as well as Dr. Tammi Klippel with radiation oncology recommended hospice. Palliative medicine was consulted this admission and plans are to discharge the patient to [facility]. Patient has tolerated IV morphine well. If requested will leave in peripheral IVs to  continue IV morphine as well as other IV medications for comfort. Per family patient has been taking oral medications with ease and may tolerate concentrated morphine solution as well.  Seizure activity -Onset after admission. Started on IV Keppra which will be transitioned to oral Keppra on date of discharge. Decadron indicated to continue after discharge in setting of known brain metastasis with cerebral edema to prevent further seizure activity; used to minimize cerebral edema.  Hypertension -The pressure remains soft and well controlled therefore home ACE inhibitor has been discontinued  Fever and leukocytosis Suspected due to aspiration or possibly due to underlying cancer processes; regardless recommendation is for comfort cares and no indication to pursue antibiotics at this time. Last white count was 18,300 on 08/15/14  Hyponatremia -Chronic in nature and multifactorial related to lung cancer as well as persistent dehydration  Procedures:  None  Consultations:  Dr. Mohamed/oncology  Dr. Manning/radiation oncology  Dr. Pilar Jarvis medicine  Discharge Exam: Filed Vitals:   08/27/14 0527  BP: 110/60  Pulse: 84  Temp: 98.3 F (36.8 C)  Resp: 15   Gen: No acute respiratory distress-very emaciated and cachectic in appearance Chest: Clear to auscultation bilaterally without wheezes, rhonchi or crackles, room air Cardiac: Regular rate and rhythm, S1-S2, no rubs murmurs or gallops, no peripheral edema, no JVD Abdomen: Soft nontender, scaphoid and nondistended without obvious hepatosplenomegaly, no ascites Extremities: Symmetrical in appearance without cyanosis, clubbing or effusion  Discharge Instructions   Discharge Instructions    Activity as tolerated - No restrictions    Complete by:  As directed      Diet general    Complete by:  As directed  Current Discharge Medication List    START taking these medications   Details  levETIRAcetam (KEPPRA)  500 MG tablet Take 1 tablet (500 mg total) by mouth 2 (two) times daily.      CONTINUE these medications which have NOT CHANGED   Details  acetaminophen (TYLENOL) 500 MG tablet Take 1,000 mg by mouth every 6 (six) hours as needed for mild pain or moderate pain.    dexamethasone (DECADRON) 4 MG tablet Take 1 tablet (4 mg total) by mouth 2 (two) times daily. Qty: 60 tablet, Refills: 3   Associated Diagnoses: Brain metastasis    Omega-3 Fatty Acids (FISH OIL) 1000 MG CAPS Take 1 capsule by mouth daily.       STOP taking these medications     lisinopril (PRINIVIL,ZESTRIL) 10 MG tablet      MOVIPREP 100 G SOLR        Allergies  Allergen Reactions  . Doxycycline     Stay asleep   . Penicillins Rash    Childhood reaction      The results of significant diagnostics from this hospitalization (including imaging, microbiology, ancillary and laboratory) are listed below for reference.    Significant Diagnostic Studies: Ct Head Wo Contrast  08/25/2014   CLINICAL DATA:  New onset seizure. History of lung cancer with brain metastasis.  EXAM: CT HEAD WITHOUT CONTRAST  TECHNIQUE: Contiguous axial images were obtained from the base of the skull through the vertex without intravenous contrast.  COMPARISON:  MRI of the head July 24, 2014  FINDINGS: New LEFT cerebellar dense 10 mm metastasis. New 21 x 17 mm LEFT occipital lobe dense metastasis with surrounding low-density vasogenic edema. New LEFT temporal pole 10 mm dense metastasis. New LEFT occipital lobe 6 mm metastasis. New LEFT frontal 25 x 24 mm dense metastasis with surrounding vasogenic edema. At least four RIGHT frontal parietal vertex dense metastasis measuring up to 19 mm at site of prior punctate enhancement. New LEFT parietal 13 x 18 mm dense metastasis.  Low-density RIGHT frontal, RIGHT parietal occipital a treated metastasis relatively unchanged in size. RIGHT frontal resection cavity.  Small amount of layering blood products in  the LEFT occipital horn. No hydrocephalus. 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 Confluent supratentorial white matter hypodensities suggest vasogenic edema and a, posttreatment changes. No acute large vascular territory infarct.  No abnormal extra-axial fluid collections. Moderate to severe calcific atherosclerosis of the carotid siphons. No paranasal sinus air-fluid levels. RIGHT frontal craniotomy and burr hole. No skull fracture.  IMPRESSION: At least 1 infratentorial and 9 supratentorial new hemorrhagic metastasis. Local mass effect without midline shift.  Small amount of intraventricular blood products without hydrocephalus.  Acute findings discussed with and reconfirmed by Dr.MCNEILL [facility] on 08/25/2014 at 4:52 am.   Electronically Signed   By: Elon Alas   On: 08/25/2014 04:55   Ct Angio Chest Pe W/cm &/or Wo Cm  08/22/2014   CLINICAL DATA:  Shortness of breath. History of lung cancer and brain metastasis.  EXAM: CT ANGIOGRAPHY CHEST WITH CONTRAST  TECHNIQUE: Multidetector CT imaging of the chest was performed using the standard protocol during bolus administration of intravenous contrast. Multiplanar CT image reconstructions and MIPs were obtained to evaluate the vascular anatomy.  CONTRAST:  146mL OMNIPAQUE IOHEXOL 350 MG/ML SOLN  COMPARISON:  Chest CT 07/03/2014  FINDINGS: Chest wall: No chest wall mass, supraclavicular or axillary lymphadenopathy. The thyroid gland is grossly normal. The bony thorax is intact. No destructive bone lesions or spinal  canal compromise.  Mediastinum: The heart is normal in size. No pericardial effusion. 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 Significant progression of left hilar and mediastinal tumor. The left hilar mass measures approximately 4.6 x 3.8 cm in the subcarinal adenopathy measures 2.6 x 2.1 cm. There is moderate compression of the left  lower lobe pulmonary artery. The esophagus is grossly normal.  Lungs: Severe emphysematous changes are again demonstrated. Stable right upper lobe scarring changes. New vague density in the left upper lobe is more likely scarring or atelectasis. The thick walled cavitary lesion in the left lower lobe has enlarged. It previously measured 4.3 x 3.7 cm and now measures 5.1 x 5.0 cm. There is surrounding marked interstitial thickening which could be obstructive pneumonitis or interstitial spread of tumor. New pulmonary nodule at the right lung base is likely metastatic disease.  Upper abdomen:  No significant findings.  Review of the MIP images confirms the above findings.  IMPRESSION: Enlarging cavitary mass in the superior segment of the left lower lobe with progressive surrounding interstitial changes which could be due to obstructive pneumonitis or interstitial spread of tumor.  Significant progression of mediastinal and left hilar lymphadenopathy with compression of the lower lobe left pulmonary artery.  New pulmonary nodule at the right lower lobe is suspicious for metastasis.  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   Electronically Signed   By: Kalman Jewels M.D.   On: 08/22/2014 13:06   Dg Chest Port 1 View  08/24/2014   CLINICAL DATA:  Diffuse chest pain with deep inspiration, worse for 2 days. Lung cancer.  EXAM: PORTABLE CHEST - 1 VIEW  COMPARISON:  Chest CT and x-ray 08/22/2014  FINDINGS: Cavitary mass lesion again noted in the left mid lung. Associated soft tissue within the cavitary lesion has increased, possibly fluid within the cavity. Left hilar fullness compatible with mass/ adenopathy as seen on prior CT. Scarring in the apices. No acute opacities on the right. No effusions. Heart is normal size.  IMPRESSION: Cavitary and left hilar mass again noted in the left lung. Possible with increasing fluid within the cavitary mass since prior study.   Electronically Signed   By:  Rolm Baptise M.D.   On: 08/24/2014 15:16   Dg Chest Port 1 View  08/22/2014   CLINICAL DATA:  History of left-sided lung cancer with metastatic disease to the brain. Subsequent encounter. Chest pain with deep breath scarring at 1800 hrs yesterday.  EXAM: PORTABLE CHEST - 1 VIEW  COMPARISON:  07/07/2014.  FINDINGS: 1033 hrs. Cavitary lesion in the left lower lobe has progressed in the interval although the airspace disease peripheral to the lesion has decreased. Multiple skin folds overlie the lateral left lung but no definite left-sided pneumothorax. Right lung shows no edema or focal airspace consolidation. Interstitial markings are diffusely coarsened with chronic features. The cardiopericardial silhouette is within normal limits for size. Telemetry leads overlie the chest.  IMPRESSION: Interval progression of cavitary left lung lesion although airspace disease peripheral to the lesion has decreased since the prior chest x-ray.   Electronically Signed   By: Misty Stanley M.D.   On: 08/22/2014 10:56    Microbiology: Recent Results (from the past 240 hour(s))  Urine culture     Status: None   Collection Time: 08/24/14  3:20 PM  Result Value Ref Range Status   Specimen Description URINE, CLEAN CATCH  Final   Special Requests NONE  Final   Colony Count   Final    2,000 COLONIES/ML Performed at [facility]    Culture   Final    INSIGNIFICANT GROWTH Performed at [facility]    Report Status 08/25/2014 FINAL  Final     Labs: Basic Metabolic Panel:  Recent Labs Lab 08/24/14 1445 08/24/14 2005 08/24/14 2300 08/25/14 0105 08/25/14 0231 08/25/14 0650  NA 127* 132* 132* 131* 129* 128*  K 4.7 3.9 4.1 3.9 4.0 3.7  CL 91* 99 98 98 100 100  CO2 25 25 25 26 25 24   GLUCOSE 572* 240* 242* 139* 172* 248*  BUN 29* 24* 25* 23 21 22   CREATININE 0.81 0.77 0.72 0.68 0.58 0.73  CALCIUM 8.7 7.9* 8.1* 8.1* 8.1* 7.8*  MG 1.9  --   --   --   --  1.7   Liver Function  Tests:  Recent Labs Lab 08/24/14 1445  AST 68*  ALT 114*  ALKPHOS 237*  BILITOT 0.9  PROT 5.5*  ALBUMIN 2.5*   No results for input(s): LIPASE, AMYLASE in the last 168 hours. No results for input(s): AMMONIA in the last 168 hours. CBC:  Recent Labs Lab 08/22/14 1035 08/24/14 1445 08/25/14 0105  WBC 16.9* 20.5* 18.3*  NEUTROABS 14.6* 17.3*  --   HGB 11.4* 8.9* 8.7*  HCT 33.2* 26.0* 24.9*  MCV 91.5 89.3 91.9  PLT 195 130* 121*   Cardiac Enzymes:  Recent Labs Lab 08/22/14 1035 08/24/14 1445 08/24/14 2005 08/25/14 0105 08/25/14 0650  TROPONINI <0.03 0.06* <0.03 <0.03 0.08*   BNP: BNP (last 3 results) No results for input(s): PROBNP in the last 8760 hours. CBG:  Recent Labs Lab 08/25/14 0920 08/25/14 1031 08/25/14 1137 08/25/14 1312 08/25/14 1434  GLUCAP 129* 152* 168* 170* 140*       Signed:  ELLIS,ALLISON L. ANP [facility] 08/27/2014, 12:24 PM   I performed a physical exam of the patient and discussed his management with NP . I reviewed the NP's note and agree with the documented findings and plan of care.  Florencia Reasons MD/PhD [facility] Hospitalist

## 2014-08-27 NOTE — Clinical Social Work Note (Signed)
Clinical Social Worker facilitated patient discharge including contacting patient family at bedside and facility to confirm patient discharge plans.  Clinical information faxed to facility and family agreeable with plan.  CSW arranged ambulance transport via [facility] to [facility].  RN to call report prior to discharge.  Clinical Social Worker will sign off for now as social work intervention is no longer needed. Please consult [place] again if new need arises.  Barbette Or, [place]

## 2014-08-27 NOTE — Progress Notes (Signed)
Report given to Colletta Maryland ,Therapist, sports at [facility]. Colletta Maryland RN stated that they do not use peripheral IVs and to take it out prior to discharge

## 2014-08-27 NOTE — Progress Notes (Signed)
Pt discharged to [facility] via EMS transport.

## 2014-08-28 ENCOUNTER — Telehealth: Payer: Self-pay | Admitting: Internal Medicine

## 2014-08-28 NOTE — Telephone Encounter (Signed)
pt called to cx appt due to now in hospice...done

## 2014-08-29 ENCOUNTER — Ambulatory Visit: Payer: Medicare HMO | Admitting: Physician Assistant

## 2014-08-29 ENCOUNTER — Other Ambulatory Visit: Payer: Medicare HMO

## 2014-09-08 DEATH — deceased

## 2014-10-14 ENCOUNTER — Encounter: Payer: Self-pay | Admitting: Radiation Oncology

## 2014-10-14 NOTE — Progress Notes (Signed)
Gave insurance form to nurse/Dr. Tammi Klippel to complete along with consult note.

## 2014-10-15 ENCOUNTER — Telehealth: Payer: Self-pay | Admitting: Radiation Oncology

## 2014-10-15 NOTE — Telephone Encounter (Signed)
Placed orange folder with complete physician statement for Dr. Tammi Klippel to sign in his inbox.

## 2014-10-29 ENCOUNTER — Encounter: Payer: Self-pay | Admitting: Radiation Oncology

## 2014-10-29 NOTE — Progress Notes (Signed)
Completed paperwork rec'd from RN/Physician for [facility].  Made copy for scanning.  Placed originals in envelope for patient's wife, Juliann Pulse, to pick up.

## 2014-11-07 ENCOUNTER — Encounter: Payer: Self-pay | Admitting: Radiation Therapy

## 2014-11-07 NOTE — Progress Notes (Signed)
Per obituary, pt expired on September 09, 2014 with family at [facility].

## 2016-05-30 IMAGING — CT CT CHEST W/ CM
1 series · 1 of 2 positions shown · IV contrast (omnipaque)
Comparison: No priors.

CLINICAL DATA: 68-year-old male with lung lesion and brain mass.
Evaluate for underlying malignancy.

EXAM:
CT CHEST, ABDOMEN, AND PELVIS WITH CONTRAST
TECHNIQUE: Multidetector CT imaging of the chest, abdomen and pelvis was
performed following the standard protocol during bolus
administration of intravenous contrast.
CONTRAST:  100mL OMNIPAQUE IOHEXOL 300 MG/ML  SOLN

[Series 100: scout · coronal · 0.6mm · 0.98mm/px · 1 of 2 slices shown]
[im 2/2]
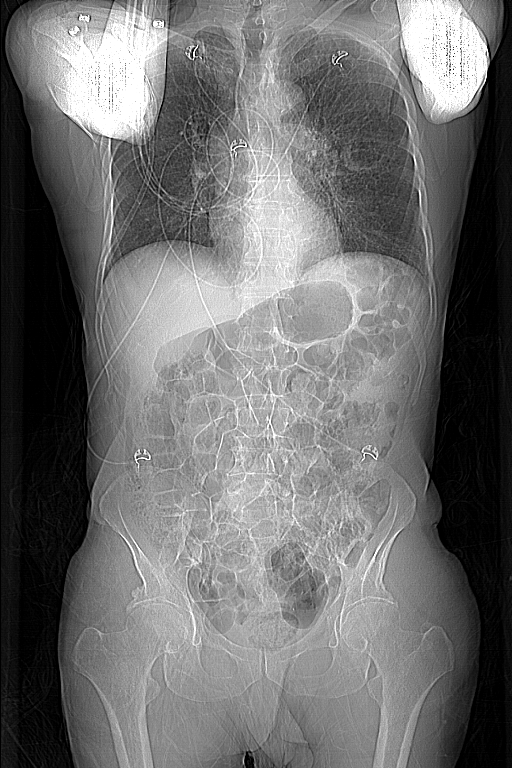

[1 of 2 positions shown; findings below may reference images not displayed]

FINDINGS: CT CHEST FINDINGS

Mediastinum: Heart size is normal. There is no significant
pericardial fluid, thickening or pericardial calcification. There is
atherosclerosis of the thoracic aorta, the great vessels of the
mediastinum and the coronary arteries, including calcified
atherosclerotic plaque in the left main, left anterior descending
and right coronary arteries. Left hilar lymphadenopathy measuring up
to 19 x 27 mm (image 26 of series 201). No other mediastinal or
right hilar adenopathy is noted. Esophagus is unremarkable in
appearance. Separate origin of the left vertebral artery directly
off the aortic arch (normal anatomical variant) incidentally noted.

Lungs/Pleura: Centered in the superior segment of the left lower
lobe is a 4.3 x 3.7 x 4.0 cm thick-walled cavitary mass, highly
suspicious for a cavitary neoplasm. Adjacent to this within the
medial aspect of the left lower lobe there is extensive airspace
consolidation and septal thickening. Focal pleuroparenchymal
architectural distortion in the apex of the right upper lobe is
strongly favored to be chronic post infectious or inflammatory
scarring. No other suspicious appearing pulmonary nodules or masses
are noted at this time. There is a background of moderate
centrilobular emphysema with mild diffuse bronchial wall thickening.
No pleural effusions. Small amount of scarring in the inferior
segment of the lingula.

Musculoskeletal: There are no aggressive appearing lytic or blastic
lesions noted in the visualized portions of the skeleton.

CT ABDOMEN AND PELVIS FINDINGS

Hepatobiliary: No focal cystic or solid hepatic lesions. No intra or
extrahepatic biliary ductal dilatation. Gallbladder is normal in
appearance.

Pancreas: Unremarkable.

Spleen: Unremarkable.

Adrenals/Urinary Tract: Extensive atrophy in the lower pole of the
right kidney, presumably from prior infection/ inflammation. Sub cm
low-attenuation lesions in the kidneys bilaterally too small to
characterize, but favored to represent small cysts. In addition,
there is a 1.6 cm simple cyst in the interpolar region of the left
kidney. No hydroureteronephrosis. Urinary bladder is normal in
appearance.

Stomach/Bowel: Normal appearance of the stomach. No pathologic
dilatation of small bowel or colon. Normal appendix (retrocecal).

Vascular/Lymphatic: Moderate to severe stenoses at the origins of
the celiac axis and superior mesenteric arteries. Complete occlusion
of the abdominal aorta immediately beneath the origin of the renal
arteries. This occlusion extends into the pelvis where the common
iliac arteries, internal iliac arteries and external iliac arteries
are all completely occluded bilaterally. There is reconstitution of
flow in the common femoral arteries bilaterally, and there are
multiple collateral arteries noted in the pelvic sidewall
bilaterally. IMA origin is also chronically occluded, although the
distal aspect of the vessel is patent secondary to a collateral
pathway which appears to involve a branch from the proximal superior
mesenteric artery. No lymphadenopathy noted in the abdomen or
pelvis.

Reproductive: Prostate gland and seminal vesicles are unremarkable
in appearance.

Other: No significant volume of ascites.  No pneumoperitoneum.

Musculoskeletal: There are no aggressive appearing lytic or blastic
lesions noted in the visualized portions of the skeleton.
IMPRESSION: 1. 4.3 x 3.7 x 4.3 cm thick-walled cavitary mass in the superior
segment of the left lower lobe with associated left hilar
adenopathy, and previously diagnosed brain metastases. Findings are
compatible with stage IV lung cancer (T2A, N1, M1b).
2. Extensive airspace consolidation in the medial aspect of the left
lower lobe. Although this could in part related to postobstructive
changes, the position of lesion argues against this. This may simply
represent endobronchial spread of hemorrhage or secretions from the
lesion, as lesion does appear to communicate with the airways.
Alternatively, this could represent some lymphangitic spread of
tumor, however, that is not strongly favored at this time.
3. Extensive atherosclerosis, including complete occlusion of the
infrarenal abdominal aorta, bilateral common iliac arteries,
bilateral internal iliac arteries and bilateral external iliac
arteries, with moderate to severe stenosis at the ostia of the
celiac axis and superior mesenteric arteries, as well as complete
occlusion at the ostium of the inferior mesenteric artery (with
collateral flow to the distal circulation of the IMA from the
proximal superior mesenteric artery). In addition, there is left
main and 2 vessel coronary artery disease.
4. Additional incidental findings, as above.

## 2016-06-20 IMAGING — MR MR HEAD WO/W CM
12 of 14 series · 28 of 48 positions shown · IV contrast (multihance)
Comparison: 07/15/2014

CLINICAL DATA: Lung cancer with brain metastases. Postop day 1
right foraminal brain metastasis resection.

EXAM:
MRI HEAD WITHOUT AND WITH CONTRAST
TECHNIQUE: Multiplanar, multiecho pulse sequences of the brain and surrounding
structures were obtained without and with intravenous contrast.
CONTRAST:  12mL MULTIHANCE GADOBENATE DIMEGLUMINE 529 MG/ML IV SOLN

[Series 2: FLAIR · sagittal · 5.0mm · 0.47mm/px · 2 of 27 slices shown (1 of 2)]
[im 1/27]
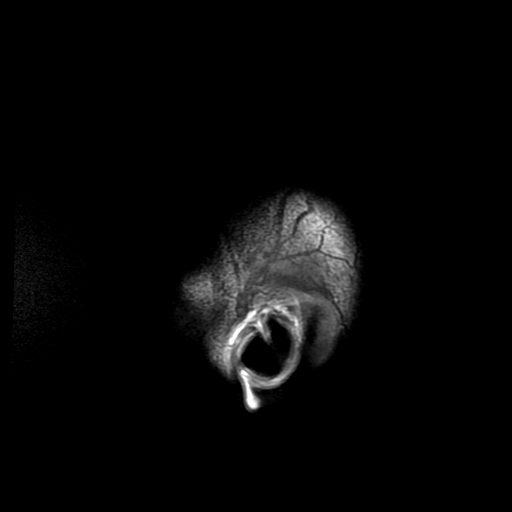
[im 27/27]
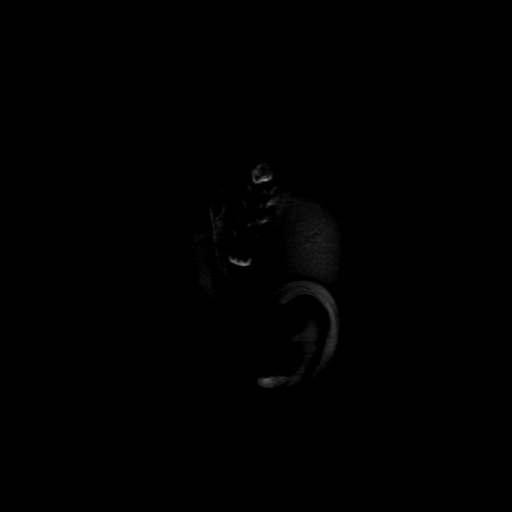

[Series 4: DWI · axial · 3.6mm · 1.02mm/px · z∈[-94,+52]mm · 6 of 83 slices shown (1 of 4)]
[im 1/83]
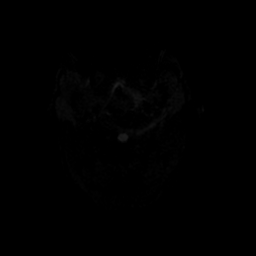
[im 17/83]
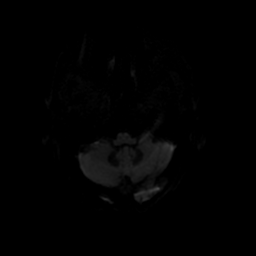
[im 33/83]
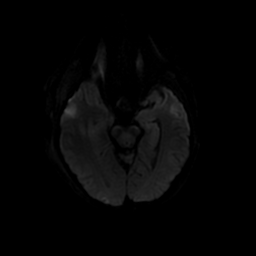
[im 50/83]
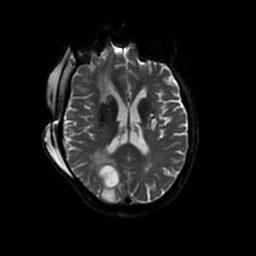
[im 66/83]
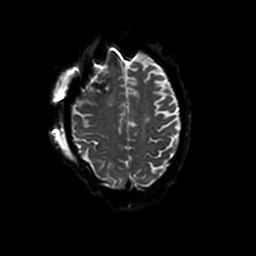
[im 83/83]
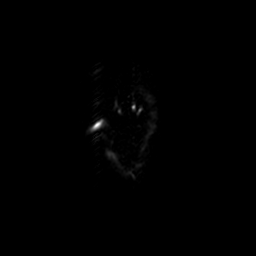

[Series 5: T2 · axial · 5.0mm · 0.43mm/px · 1 of 26 slices shown]
[im 1/26]
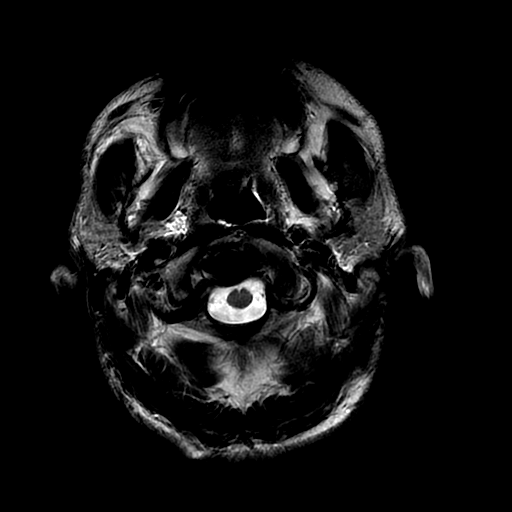

[Series 6: FLAIR · axial · 5.0mm · 0.47mm/px · 1 of 26 slices shown (2 of 2)]
[im 1/26]
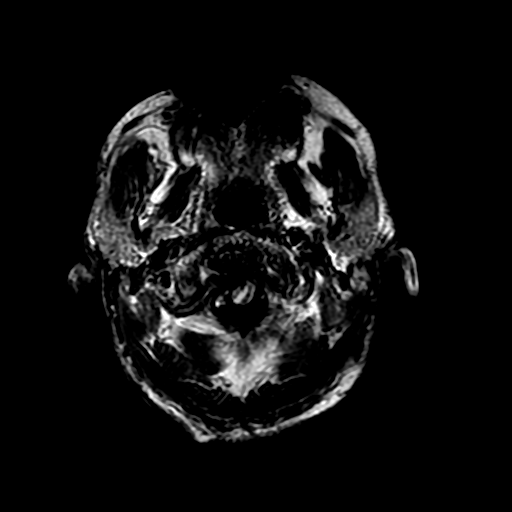

[Series 7: DWI · coronal · 5.0mm · 1.02mm/px · 4 of 75 slices shown (2 of 4)]
[im 1/75]
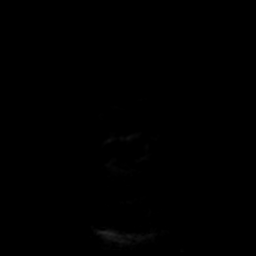
[im 25/75]
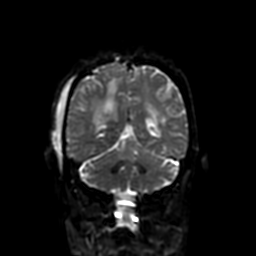
[im 50/75]
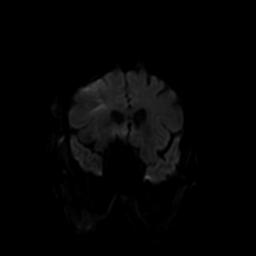
[im 75/75]
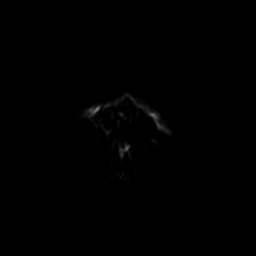

[Series 8: (person_name) · axial · 3.6mm · 0.43mm/px · z∈[-61,+3]mm · 4 of 176 slices shown]
[im 1/176]
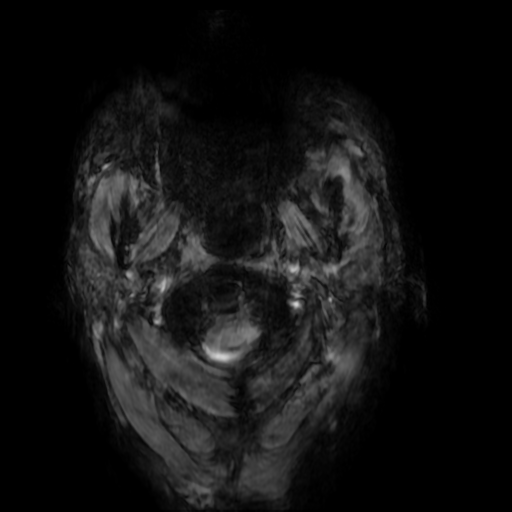
[im 20/176]
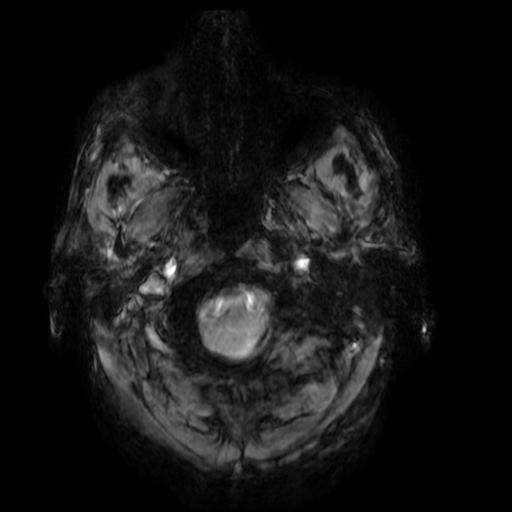
[im 59/176]
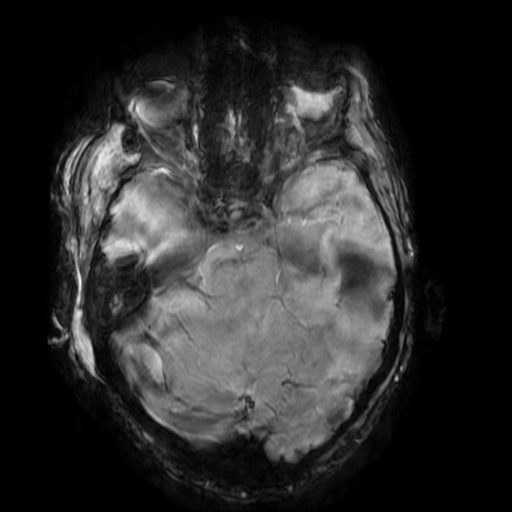
[im 78/176]
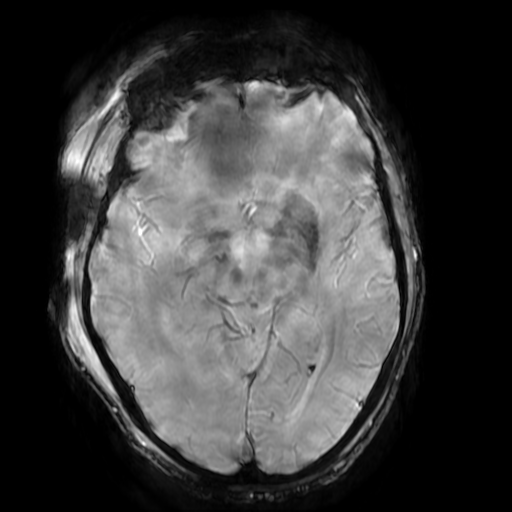

[Series 10: T2 post-contrast · coronal · 5.0mm · 0.47mm/px · 2 of 32 slices shown]
[im 1/32]
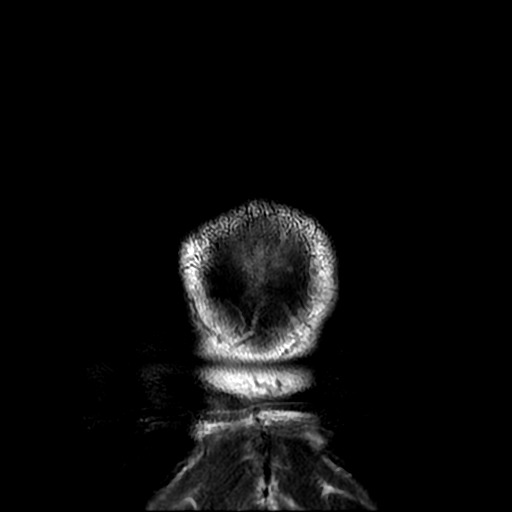
[im 32/32]
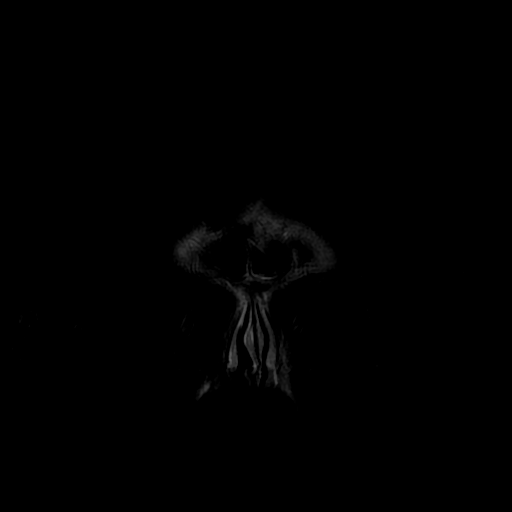

[Series 11: T1 · axial · 5.0mm · 0.47mm/px · 1 of 26 slices shown (1 of 3)]
[im 1/26]
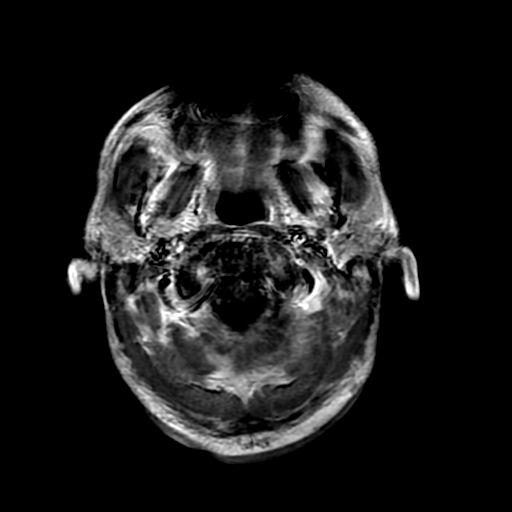

[Series 12: T1 · coronal · 5.0mm · 0.47mm/px · 2 of 32 slices shown (2 of 3)]
[im 1/32]
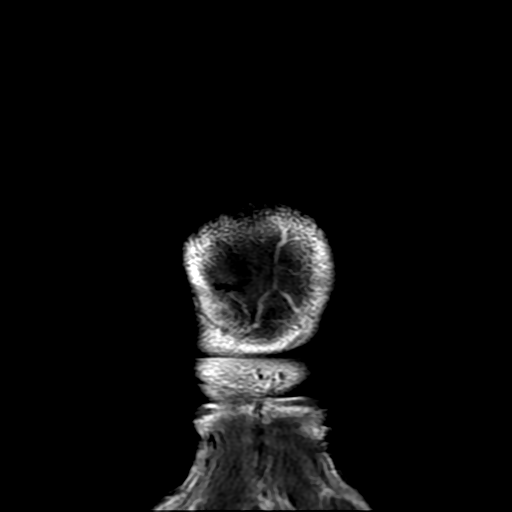
[im 32/32]
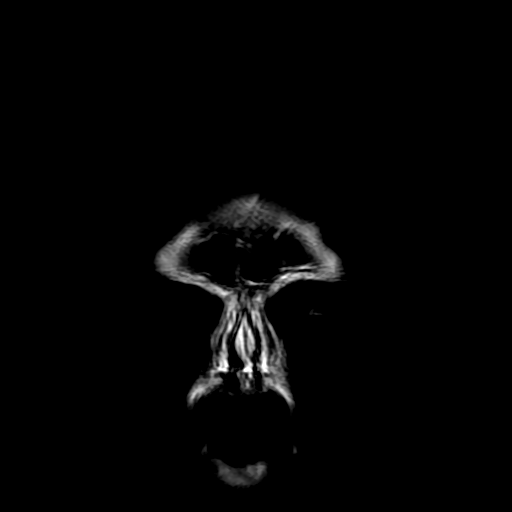

[Series 13: T1 · sagittal · 5.0mm · 0.47mm/px · 1 of 27 slices shown (3 of 3)]
[im 1/27]
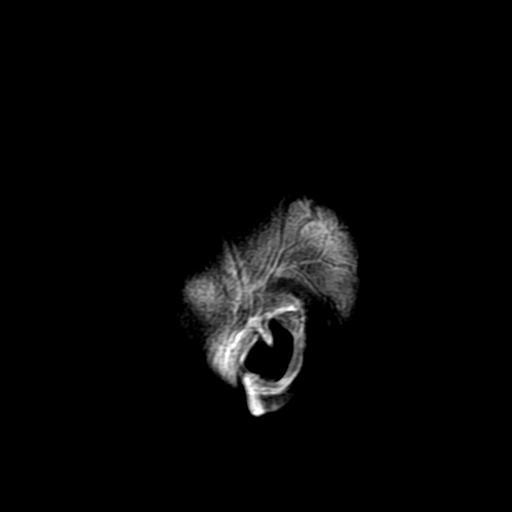

[Series 400: DWI · axial · 3.6mm · 1.02mm/px · z∈[-94,+52]mm · 2 of 42 slices shown (3 of 4)]
[im 1/42]
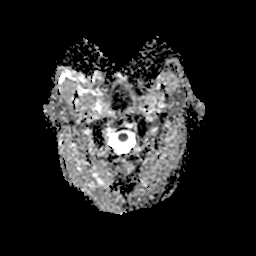
[im 42/42]
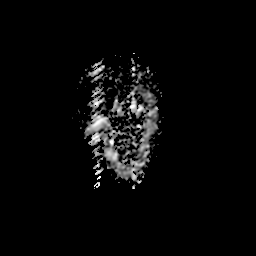

[Series 700: DWI · coronal · 5.0mm · 1.02mm/px · 2 of 37 slices shown (4 of 4)]
[im 1/37]
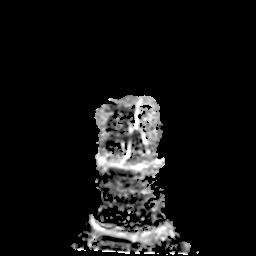
[im 37/37]
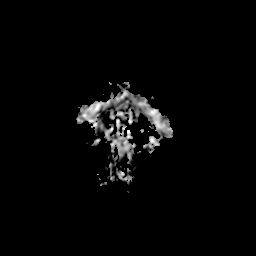

[28 of 48 positions shown; findings below may reference images not displayed]

FINDINGS: There is a 9 mm focus of restricted diffusion involving anterior
left frontal cortex on coronal diffusion images (series 7 images 30
and 31). Sequelae of interval right frontal craniotomy and right
frontal mass resection are identified. Minimal right frontal
extra-axial fluid/ hematoma is noted. Small amount of pneumocephalus
is noted. There is also trace blood in the atrium/ occipital horn of
the left lateral ventricle.

Vasogenic edema in the right frontal lobe has decreased from the
prior study. Blood products are present in the right frontal
resection cavity with associated intrinsic T1 hyperintensity. Mild
restricted diffusion at the margins of the cavity is likely
postoperative. There is likely minimal enhancement at the margins of
the cavity without clear nodular enhancement identified, however
evaluation is mildly limited by motion artifact, blood products, and
different scanning technique between the pre and postcontrast axial
T1 images.

Ring-enhancing right parietal lesion measures 2.9 x 2.4 cm,
unchanged. Surrounding vasogenic edema has slightly decreased.
cm right occipital lesion does not appear significantly changed.
There is a 3 mm punctate focus of cortical enhancement in the right
frontoparietal region near the vertex which was not clearly present
on the prior studies (series 11, image 21 and series 12, image 14).
There is no associated edema.

Apparent enhancement in the left superior temporal gyrus on the
prior study is no longer seen and was likely artifactual as
previously suggested. Leftward midline shift is noted. Chronic left
greater than right basal ganglia and bilateral thalamic lacunar
infarcts are again seen.

Orbits are unremarkable. Focal right posterior ethmoid air cell
mucosal thickening is noted. Mastoid air cells are clear. Major
intracranial vascular flow voids are preserved.
IMPRESSION: 1. Interval right frontal mass resection with minimal enhancement at
the margins of the cavity as above. Decreased adjacent edema.
2. Unchanged right parietal and right occipital lesions.
3. New 3 mm focus of enhancement in the right frontoparietal region,
possibly an additional metastasis.
4. Subcentimeter acute left frontal cortical infarct.
